# Patient Record
Sex: Female | Born: 1950 | Race: White | Hispanic: No | Marital: Married | Smoking: Current every day smoker
Health system: Southern US, Community
[De-identification: ages and names within clinical notes are randomized; demographics above are authoritative.]

## PROBLEM LIST (undated history)

## (undated) DIAGNOSIS — I8390 Asymptomatic varicose veins of unspecified lower extremity: Secondary | ICD-10-CM

## (undated) DIAGNOSIS — E669 Obesity, unspecified: Secondary | ICD-10-CM

## (undated) DIAGNOSIS — E119 Type 2 diabetes mellitus without complications: Secondary | ICD-10-CM

## (undated) DIAGNOSIS — E1140 Type 2 diabetes mellitus with diabetic neuropathy, unspecified: Secondary | ICD-10-CM

## (undated) DIAGNOSIS — I509 Heart failure, unspecified: Secondary | ICD-10-CM

## (undated) HISTORY — PX: LEG SURGERY: SHX1003

## (undated) HISTORY — PX: TUBAL LIGATION: SHX77

---

## 2000-08-30 ENCOUNTER — Encounter: Payer: Self-pay | Admitting: Emergency Medicine

## 2000-08-30 ENCOUNTER — Emergency Department (HOSPITAL_COMMUNITY): Admission: EM | Admit: 2000-08-30 | Discharge: 2000-08-30 | Payer: Self-pay | Admitting: Emergency Medicine

## 2000-10-18 ENCOUNTER — Ambulatory Visit (HOSPITAL_BASED_OUTPATIENT_CLINIC_OR_DEPARTMENT_OTHER): Admission: RE | Admit: 2000-10-18 | Discharge: 2000-10-18 | Payer: Self-pay | Admitting: Orthopaedic Surgery

## 2004-09-22 ENCOUNTER — Encounter: Admission: RE | Admit: 2004-09-22 | Discharge: 2004-09-22 | Payer: Self-pay | Admitting: Internal Medicine

## 2006-03-29 ENCOUNTER — Ambulatory Visit (HOSPITAL_BASED_OUTPATIENT_CLINIC_OR_DEPARTMENT_OTHER): Admission: RE | Admit: 2006-03-29 | Discharge: 2006-03-29 | Payer: Self-pay | Admitting: Orthopedic Surgery

## 2006-03-29 ENCOUNTER — Encounter (INDEPENDENT_AMBULATORY_CARE_PROVIDER_SITE_OTHER): Payer: Self-pay | Admitting: *Deleted

## 2006-07-21 ENCOUNTER — Encounter: Admission: RE | Admit: 2006-07-21 | Discharge: 2006-07-21 | Payer: Self-pay | Admitting: Internal Medicine

## 2007-03-02 ENCOUNTER — Encounter: Admission: RE | Admit: 2007-03-02 | Discharge: 2007-03-02 | Payer: Self-pay | Admitting: Interventional Radiology

## 2007-05-30 ENCOUNTER — Encounter: Admission: RE | Admit: 2007-05-30 | Discharge: 2007-05-30 | Payer: Self-pay | Admitting: Interventional Radiology

## 2007-06-06 ENCOUNTER — Encounter: Admission: RE | Admit: 2007-06-06 | Discharge: 2007-06-06 | Payer: Self-pay | Admitting: Interventional Radiology

## 2007-06-29 ENCOUNTER — Encounter: Admission: RE | Admit: 2007-06-29 | Discharge: 2007-06-29 | Payer: Self-pay | Admitting: Interventional Radiology

## 2007-12-13 ENCOUNTER — Encounter: Payer: Self-pay | Admitting: Interventional Radiology

## 2008-11-02 IMAGING — US US MFM FOLLOW-UP FOCUS VISIT
1 series · 13 of 28 positions shown · non-contrast
Comparison: none

[Series 1: us mfm follow-up focus visit · 13 of 33 slices shown]
[im 2/33]
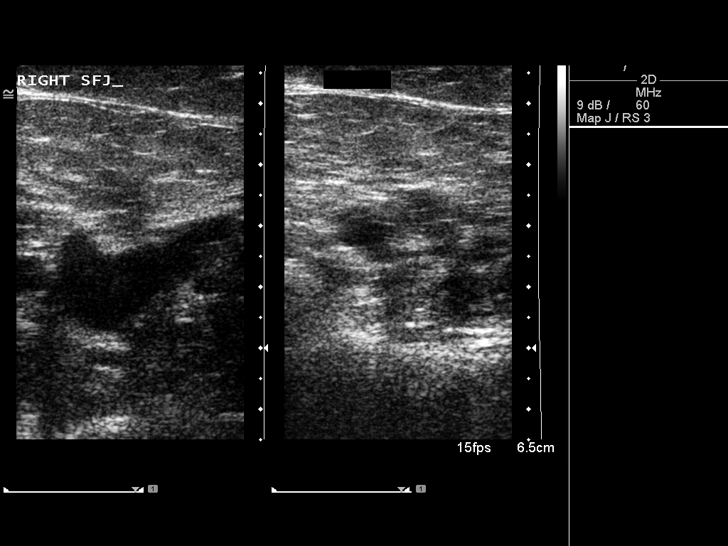
[im 4/33]
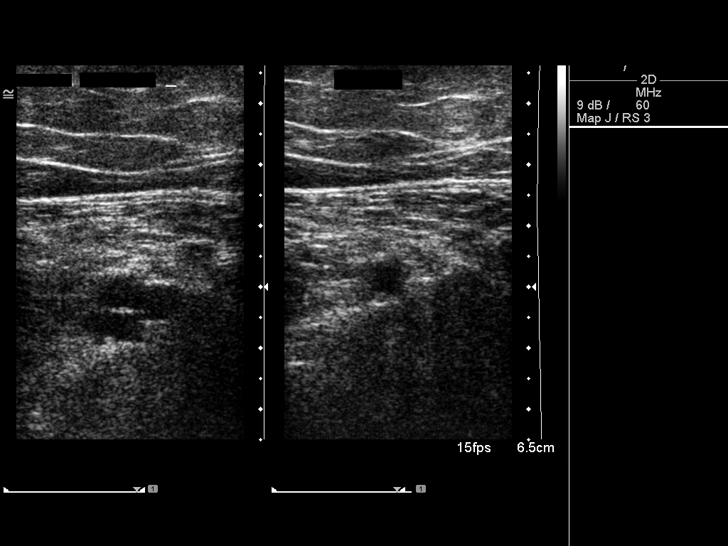
[im 6/33]
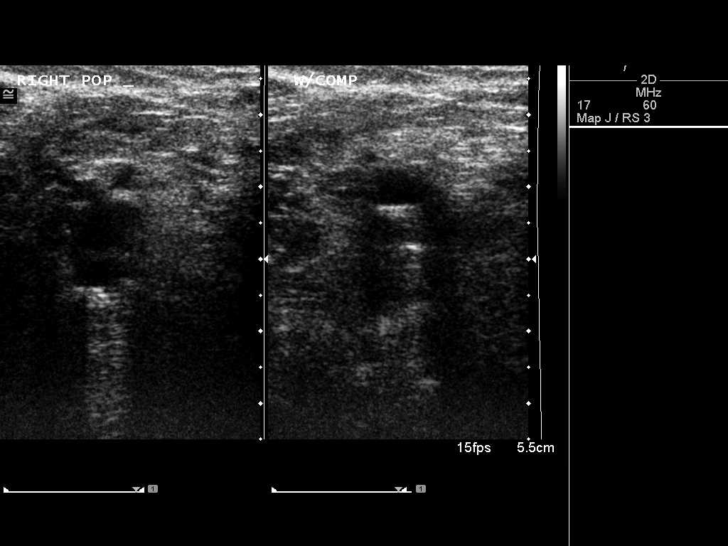
[im 9/33]
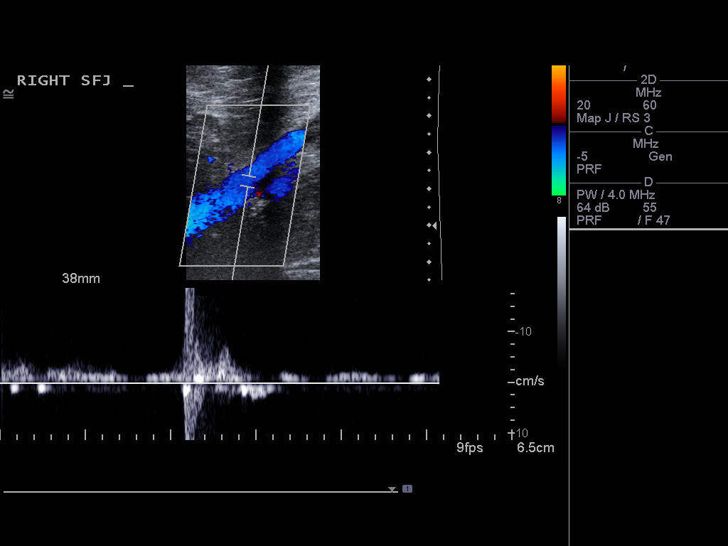
[im 11/33]
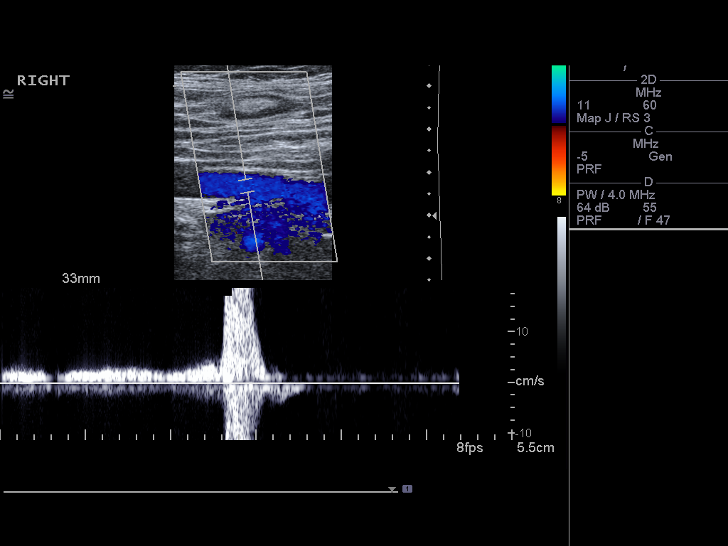
[im 14/33]
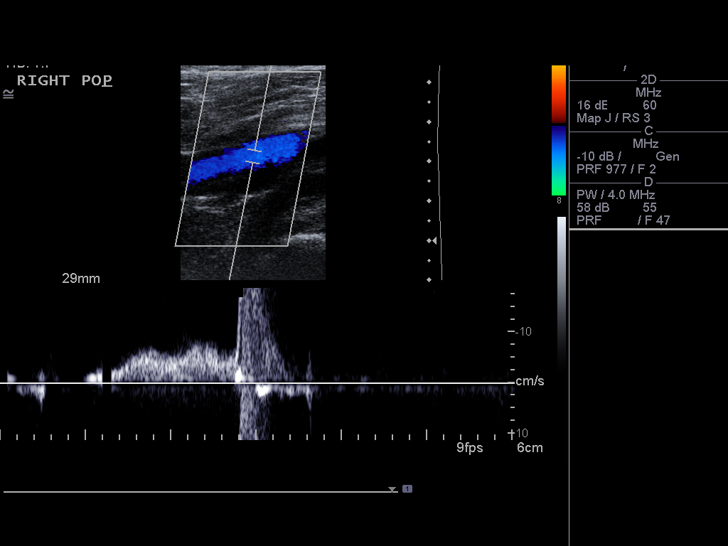
[im 17/33]
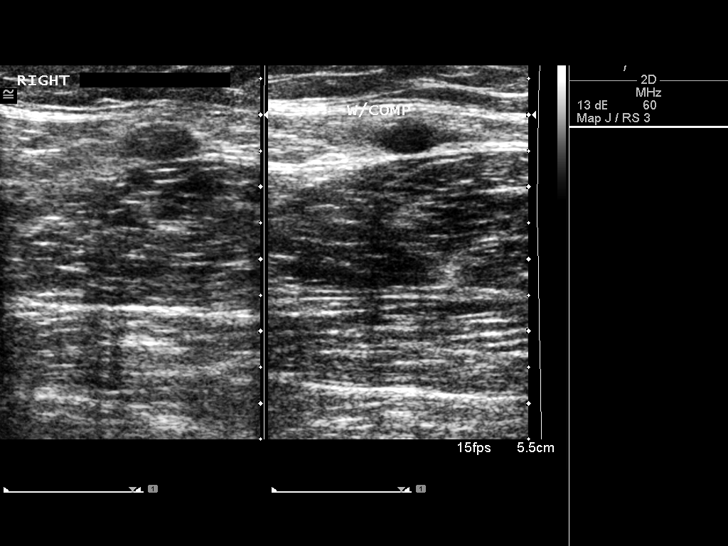
[im 19/33]
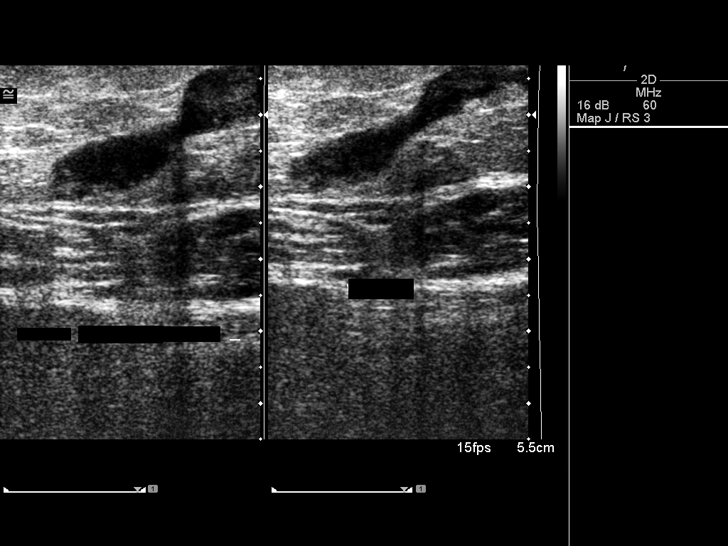
[im 22/33]
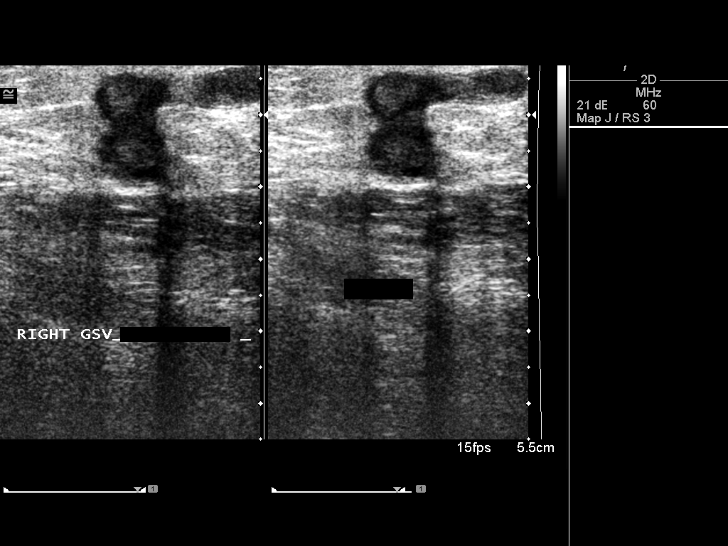
[im 24/33]
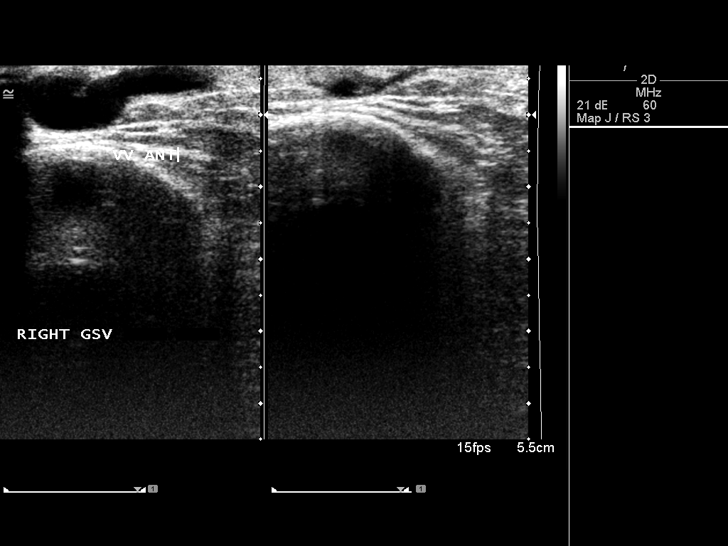
[im 27/33]
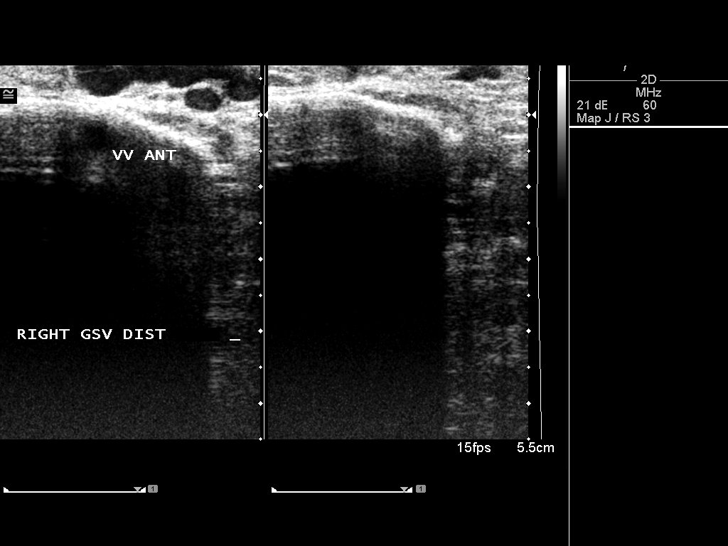
[im 29/33]
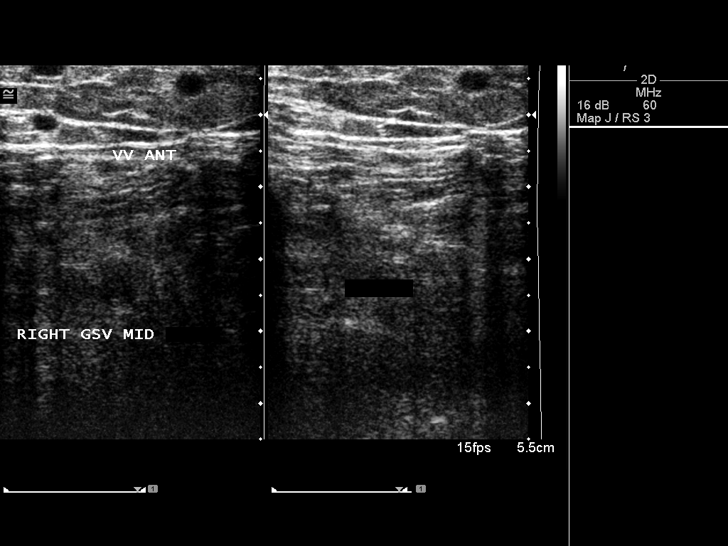
[im 31/33]
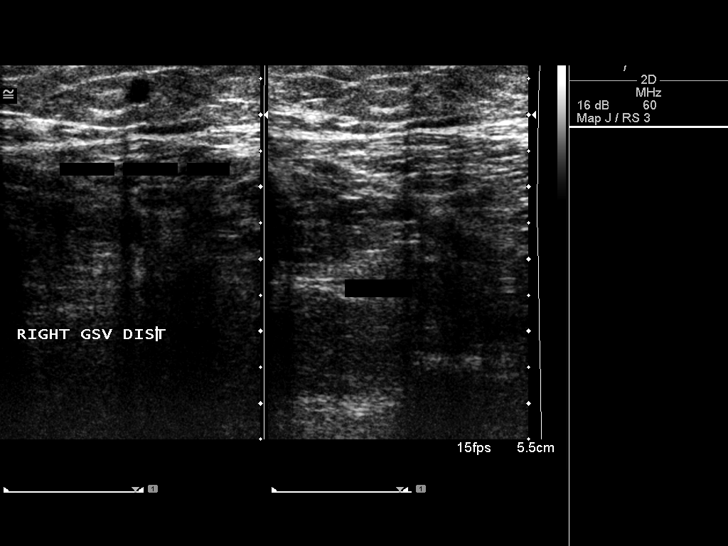

[13 of 28 positions shown; findings below may reference images not displayed]

ULTRASOUND FOCUS VISIT - 06/29/07:
 June 29, 2007
 Deniisa Goy, M.D. 
 [REDACTED] [REDACTED]
 RE:  Mihku (DOB - [birth year])
 Dear Dr. Sohail Gusman:
 I had the opportunity to see your patient, Tze Leung, today, at her scheduled appointment one month status post transcatheter laser occlusion of the right greater saphenous vein to treat her symptomatic varicose veins. 
 She is doing well.  The tenderness along the treatment segment has decreased.  Bruising has resolved.  She still notices some distended varicose veins along the anterior and medial aspect of her calf when she is standing and active.  These decompress with leg elevation and at night.  Her leg pain has improved.  She is able to tolerate full range of activities.  She is using and tolerating her thigh high graduated compression hose.  
 On exam, there is no significant tenderness, redness, or fluctuance along the treatment course.  The bruising has resolved.  There are still moderately large and mildly distended varicose veins along the anterior and medial aspect of the calf.  
 Today?s venous Doppler examination shows continued complete closure of the treated GSV segment with occlusion of some of the larger varicose veins at the level of the knee.  The network of varicose veins anterior and medial to the calf are again evident, with communication to a perforator vein noted.  
 My impression is that she has done well one month status post transcatheter laser occlusion of the right greater saphenous vein for her symptomatic varicose veins, with good technical success in the treated segment.  I had hoped that her calf varicose veins would more significantly decompress once the retrograde flow from the saphenous was eliminated.  However, these appear to continue to be filled via this prominent perforator.  Should these remain symptomatic, she would certainly be an appropriate candidate for percutaneous directed foam sclerotherapy injection into these residual varicose veins, with an anticipated durable result.  I discussed the procedure, technique, anticipated benefits, and possible side effects with the patient.  She was inclined to proceed with this before the [REDACTED]time.  Accordingly, we will try to set this up at her convenience. 
 I appreciate your referral of this very pleasant patient.  I am optimistic we will be able to give her further relief of her symptoms from the varicose veins.  I will keep you updated with her progress.
 Sincerely,
 DDH:chc

## 2010-05-17 ENCOUNTER — Encounter: Payer: Self-pay | Admitting: Internal Medicine

## 2010-09-11 NOTE — Op Note (Signed)
[facility]  Patient:    Marie Stone, Marie Stone                      MRN: [id number] Proc. Date: 10/18/00 Adm. Date:  [id number] Attending:  Marcene Corning                           Operative Report  PREOPERATIVE DIAGNOSIS:  Right knee anterior cruciate ligament tear.  POSTOPERATIVE DIAGNOSIS: 1. Right knee anterior cruciate ligament tear. 2. Right knee degenerative joint disease.  OPERATION PERFORMED: 1. Right knee anterior cruciate ligament debridement. 2. Right knee chondroplasty, medial femoral condyle and lateral femoral condyle.  ANESTHESIA:  Knee block.  ATTENDING SURGEON:  Lubertha Basque. Jerl Santos, M.D.  ASSISTANT:  Lindwood Qua, P.A.  INDICATIONS FOR PROCEDURE:  The patient is a 60 year old woman who injured her knee some time ago.  She has undergone a preoperative MRI scan which shows an ACL tear and some mild degenerative changes.  At this point she cannot straighten her knee fully.  She was offered an arthroscopy to go in and perform a debridement of the impinging portion of her ACL and to address other things found.  We elected not to go ahead with an ACL reconstruction as she has a fairly low demand knee in terms of athletic endeavors and does not feel any instability.  The procedure was discussed with the patient and informed operative consent was obtained after discussion of possible complications of reaction to anesthesia, infection and possible need for further surgery.  DESCRIPTION OF PROCEDURE:  The patient was taken to an operating suite where knee block anesthesia was applied without difficulty.  She was then positioned supine and prepped and draped in normal sterile fashion.  After administration of preop IV antibiotics, an arthroscopy of the right knee was performed through two inferior portals.  The suprapatellar pouch was benign as was the patellofemoral joint.  Both gutters were benign.  The medial compartment exhibited  some grade 3 change in a tiny area which was dressed with a brief chondroplasty.  In the lateral compartment she likewise had some grade three change in a very small area and this was addressed with a chondroplasty.  The meniscal structures in both compartments were normal.  In the notch she had an intact PCL with an at least partially torn ACL.  A stump of the ACL was stuck in an anterior position and was excised.  Perhaps 60% of an ACL remained though it certainly did not appear normal, it did appear to tighten on drawer testing.  This was left in place.  The knee was thoroughly irrigated followed by placement of Marcaine with epinephrine and morphine.  Adaptic was placed over her portals followed by dry gauze and a loose Ace wrap.  Estimated blood loss and intraoperative fluids can be obtained from Anesthesia records.  DISPOSITION:  The patient was taken to the recovery room in stable condition. Plans were for her to go home the same day and to follow up in the office in less than a week.  I will contact her by phone tonight. DD:  10/18/00 TD:  10/18/00 Job: [id number]

## 2010-09-11 NOTE — Op Note (Signed)
NAMEJARETZI, DROZ               ACCOUNT NO.:  [id number]   MEDICAL RECORD NO.:  [id number]          PATIENT TYPE:  AMB   LOCATION:  DSC                          FACILITY:  [facility]   PHYSICIAN:  Cindee Salt, M.D.       DATE OF BIRTH:  [birth year]   DATE OF PROCEDURE:  03/29/2006  DATE OF DISCHARGE:                               OPERATIVE REPORT   PREOPERATIVE DIAGNOSIS:  Fungal infection, right thumb nail plate, nail  bed.   POSTOPERATIVE DIAGNOSIS:  Fungal infection, right thumb nail plate, nail  bed.   OPERATION:  Removal nail plate, debridement of nail matrix right thumb.   SURGEON:  Cindee Salt, M.D.   ASSISTANT:  Carolyne Fiscal R.N.   ANESTHESIA:  Metacarpal block.   HISTORY:  The patient is a 60 year old female with a history of  infection of her right thumb nail bed.  This has lifted the entire nail,  has obvious thickening of the nail with discoloration with probability  being a fungal infection.  Plan is for removal of the nail plate  debridement.   PROCEDURE:  The patient was given a metacarpal block with 1% Xylocaine  without epinephrine, prepped using DuraPrep, supine position, right arm  free.  After adequate anesthesia was obtained, the thumb was  exsanguinated from the nail IP joint proximally.  The nail plate was  removed with a Therapist, nutritional.  Significant white drainage was present  and this was quite thick and tenacious.  This was then debrided along  with the thickening of the nail matrix distally.  This was irrigated.  The specimen sent for pathology for AFB, fungal, aerobic and anaerobic  cultures.  A nonadherent gauze was placed and a compressive dressing  applied.  The patient tolerated the procedure well.  She is discharged  home to return in three days on Tylenol 3 and Lamisil.           ______________________________  Cindee Salt, M.D.     GK/MEDQ  D:  03/29/2006  T:  03/30/2006  Job:  [id number]

## 2011-08-05 ENCOUNTER — Encounter (HOSPITAL_COMMUNITY): Payer: Self-pay | Admitting: *Deleted

## 2011-08-05 ENCOUNTER — Emergency Department (HOSPITAL_COMMUNITY)
Admission: EM | Admit: 2011-08-05 | Discharge: 2011-08-05 | Disposition: A | Discharge status: Home / Self Care | Payer: Self-pay | Attending: Emergency Medicine | Admitting: Emergency Medicine

## 2011-08-05 ENCOUNTER — Emergency Department (HOSPITAL_COMMUNITY): Payer: Self-pay

## 2011-08-05 DIAGNOSIS — T07XXXA Unspecified multiple injuries, initial encounter: Secondary | ICD-10-CM

## 2011-08-05 DIAGNOSIS — W19XXXA Unspecified fall, initial encounter: Secondary | ICD-10-CM

## 2011-08-05 DIAGNOSIS — IMO0002 Reserved for concepts with insufficient information to code with codable children: Secondary | ICD-10-CM | POA: Insufficient documentation

## 2011-08-05 DIAGNOSIS — S46919A Strain of unspecified muscle, fascia and tendon at shoulder and upper arm level, unspecified arm, initial encounter: Secondary | ICD-10-CM

## 2011-08-05 DIAGNOSIS — W010XXA Fall on same level from slipping, tripping and stumbling without subsequent striking against object, initial encounter: Secondary | ICD-10-CM | POA: Insufficient documentation

## 2011-08-05 NOTE — Discharge Instructions (Signed)
Your x-rays today did not show any broken bones or dislocation in your shoulder. At this time your providers recommend rest, ice, compression and elevation to help reduce pain and swelling in your shoulder. Continue to use naproxen every 12 hours as instructed on the bottle. You may also use your tramadol at home as prescribed by your doctor for severe pains. Please followup with your doctor next week for continuation and treatment.   Shoulder Sprain A shoulder sprain is the result of damage to the tough, fiber-like tissues (ligaments) that help hold your shoulder in place. The ligaments may be stretched or torn. Besides the main shoulder joint (the ball and socket), there are several smaller joints that connect the bones in this area. A sprain usually involves one of those joints. Most often it is the acromioclavicular (or AC) joint. That is the joint that connects the collarbone (clavicle) and the shoulder blade (scapula) at the top point of the shoulder blade (acromion). A shoulder sprain is a mild form of what is called a shoulder separation. Recovering from a shoulder sprain may take some time. For some, pain lingers for several months. Most people recover without long term problems. CAUSES   A shoulder sprain is usually caused by some kind of trauma. This might be:   Falling on an outstretched arm.   Being hit hard on the shoulder.   Twisting the arm.   Shoulder sprains are more likely to occur in people who:   Play sports.   Have balance or coordination problems.  SYMPTOMS   Pain when you move your shoulder.   Limited ability to move the shoulder.   Swelling and tenderness on top of the shoulder.   Redness or warmth in the shoulder.   Bruising.   A change in the shape of the shoulder.  DIAGNOSIS  Your healthcare provider may:  Ask about your symptoms.   Ask about recent activity that might have caused those symptoms.   Examine your shoulder. You may be asked to do  simple exercises to test movement. The other shoulder will be examined for comparison.   Order some tests that provide a look inside the body. They can show the extent of the injury. The tests could include:   X-rays.   CT (computed tomography) scan.   MRI (magnetic resonance imaging) scan.  RISKS AND COMPLICATIONS  Loss of full shoulder motion.   Ongoing shoulder pain.  TREATMENT  How long it takes to recover from a shoulder sprain depends on how severe it was. Treatment options may include:  Rest. You should not use the arm or shoulder until it heals.   Ice. For 2 or 3 days after the injury, put an ice pack on the shoulder up to 4 times a day. It should stay on for 15 to 20 minutes each time. Wrap the ice in a towel so it does not touch your skin.   Over-the-counter medicine to relieve pain.   A sling or brace. This will keep the arm still while the shoulder is healing.   Physical therapy or rehabilitation exercises. These will help you regain strength and motion. Ask your healthcare provider when it is OK to begin these exercises.   Surgery. The need for surgery is rare with a sprained shoulder, but some people may need surgery to keep the joint in place and reduce pain.  HOME CARE INSTRUCTIONS   Ask your healthcare provider about what you should and should not do while your shoulder  heals.   Make sure you know how to apply ice to the correct area of your shoulder.   Talk with your healthcare provider about which medications should be used for pain and swelling.   If rehabilitation therapy will be needed, ask your healthcare provider to refer you to a therapist. If it is not recommended, then ask about at-home exercises. Find out when exercise should begin.  SEEK MEDICAL CARE IF:  Your pain, swelling, or redness at the joint increases. SEEK IMMEDIATE MEDICAL CARE IF:   You have a fever.   You cannot move your arm or shoulder.  Document Released: 08/29/2008 Document  Revised: 04/01/2011 Document Reviewed: 08/29/2008 [facility] Patient Information 2012 [place], [place].    Abrasions Abrasions are skin scrapes. Their treatment depends on how large and deep the abrasion is. Abrasions do not extend through all layers of the skin. A cut or lesion through all skin layers is called a laceration. HOME CARE INSTRUCTIONS   If you were given a dressing, change it at least once a day or as instructed by your caregiver. If the bandage sticks, soak it off with a solution of water or hydrogen peroxide.   Twice a day, wash the area with soap and water to remove all the cream/ointment. You may do this in a sink, under a tub faucet, or in a shower. Rinse off the soap and pat dry with a clean towel. Look for signs of infection (see below).   Reapply cream/ointment according to your caregiver's instruction. This will help prevent infection and keep the bandage from sticking. Telfa or gauze over the wound and under the dressing or wrap will also help keep the bandage from sticking.   If the bandage becomes wet, dirty, or develops a foul smell, change it as soon as possible.   Only take over-the-counter or prescription medicines for pain, discomfort, or fever as directed by your caregiver.  SEEK IMMEDIATE MEDICAL CARE IF:   Increasing pain in the wound.   Signs of infection develop: redness, swelling, surrounding area is tender to touch, or pus coming from the wound.   You have a fever.   Any foul smell coming from the wound or dressing.  Most skin wounds heal within ten days. Facial wounds heal faster. However, an infection may occur despite proper treatment. You should have the wound checked for signs of infection within 24 to 48 hours or sooner if problems arise. If you were not given a wound-check appointment, look closely at the wound yourself on the second day for early signs of infection listed above. MAKE SURE YOU:   Understand these instructions.   Will watch  your condition.   Will get help right away if you are not doing well or get worse.  Document Released: 01/20/2005 Document Revised: 04/01/2011 Document Reviewed: 03/16/2011 [facility] Patient Information 2012 [place], [place].

## 2011-08-05 NOTE — ED Notes (Signed)
Patient d/c prior to ortho tech arriving for arm sling.  Patient states she will buy one at local drug store to save money. Patient aware of sling instructions and has previous knowledge of wearing arm sling

## 2011-08-05 NOTE — ED Notes (Signed)
Pt sts fell while she was ambulating. Pt now has pain with movement to her L shoulder. CMS intact.

## 2011-08-05 NOTE — ED Provider Notes (Signed)
History     CSN: [id number]  Arrival date & time 08/05/11  1940   First MD Initiated Contact with Patient 08/05/11 2115      Chief Complaint  Patient presents with  . Fall    HPI  History provided by the patient and spouse. Patient is a 61 year old female with no significant past medical history who presents with left shoulder pains after a fall. Patient reports walking outside and slightly mis-stepping with right foot off the edges of the St. causing her to lose balance and fall. Patient reports falling onto left side. She is small abrasion to left great toe, left knee and left elbow. Patient complains of left shoulder pain this is worse with movements. Patient did try taking naproxen earlier today with only slight relief. Pain is better with rest. Patient denies head injury, neck pain or LOC. She denies any numbness or weakness in extremities. Symptoms are described as moderate. Patient denies any other aggravating or alleviating factors.    No past medical history on file.  No past surgical history on file.  No family history on file.  History  Substance Use Topics  . Smoking status: Not on file  . Smokeless tobacco: Not on file  . Alcohol Use: Not on file    OB History    Grav Para Term Preterm Abortions TAB SAB Ect Mult Living                  Review of Systems  HENT: Negative for neck pain.   Respiratory: Negative for shortness of breath.   Cardiovascular: Negative for chest pain.  Gastrointestinal: Negative for abdominal pain.  Musculoskeletal: Negative for back pain.  Neurological: Negative for dizziness, weakness, light-headedness, numbness and headaches.    Allergies  Review of patient's allergies indicates no known allergies.  Home Medications   Current Outpatient Rx  Name Route Sig Dispense Refill  . NAPROXEN PO Oral Take 1 tablet by mouth as needed. For pain.      BP 153/83  Pulse 91  Temp(Src) 98.4 F (36.9 C) (Oral)  Resp 20  SpO2  95%  Physical Exam  Nursing note and vitals reviewed. Constitutional: She is oriented to person, place, and time. She appears well-developed and well-nourished. No distress.  HENT:  Head: Normocephalic and atraumatic.       No battle sign or raccoon eyes  Neck: Normal range of motion. Neck supple.       No cervical midline tenderness  Cardiovascular: Normal rate and regular rhythm.   Pulmonary/Chest: Effort normal and breath sounds normal. No respiratory distress. She has no wheezes. She has no rales.  Musculoskeletal:       Reduced range of motion of left shoulder secondary to pain. No gross deformity. No tenderness palpation over clavicle or a.c. joint. Pain over anterior shoulder. Normal range of motion of elbow, wrist and hand. Normal grip strength. Normal radial pulses, sensation in fingers and cap refill.  Neurological: She is alert and oriented to person, place, and time. She has normal strength. No sensory deficit. Gait normal.  Skin: Skin is warm and dry. No rash noted.       1 cm circular abrasion to medial left great toe. Small abrasions over left knee. 4 cm abrasion over her posterior left elbow with mild swelling.  Psychiatric: She has a normal mood and affect. Her behavior is normal.    ED Course  Procedures   Dg Shoulder Left  08/05/2011  *RADIOLOGY REPORT*  Clinical Data: Fall.  Shoulder pain.  Limited mobility.  LEFT SHOULDER - 2+ VIEW  Comparison: None.  Findings: Acromioclavicular joint degenerative changes with bony overgrowth.  No fracture or dislocation.  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  No fracture or dislocation.  Original Report Authenticated By: Fuller Canada, M.D.     1. Fall   2. Abrasions of multiple sites   3. Shoulder strain       MDM  9:30 PM patient seen and evaluated. Patient in no acute distress.        Angus Seller, [place] 08/06/11 [phone]

## 2011-08-09 NOTE — ED Provider Notes (Signed)
Medical screening examination/treatment/procedure(s) were performed by non-physician practitioner and as supervising physician I was immediately available for consultation/collaboration.   Pheonix Clinkscale, MD 08/09/11 2117 

## 2012-12-20 ENCOUNTER — Other Ambulatory Visit: Payer: Self-pay | Admitting: Family Medicine

## 2012-12-20 DIAGNOSIS — Z1231 Encounter for screening mammogram for malignant neoplasm of breast: Secondary | ICD-10-CM

## 2013-01-10 ENCOUNTER — Ambulatory Visit: Payer: Self-pay

## 2013-02-13 ENCOUNTER — Emergency Department (HOSPITAL_COMMUNITY): Payer: BC Managed Care – PPO

## 2013-02-13 ENCOUNTER — Inpatient Hospital Stay (HOSPITAL_COMMUNITY)
Admission: EM | Admit: 2013-02-13 | Discharge: 2013-02-23 | DRG: 872 | Disposition: A | Discharge status: Home / Self Care | Payer: BC Managed Care – PPO | Attending: Internal Medicine | Admitting: Internal Medicine

## 2013-02-13 ENCOUNTER — Encounter (HOSPITAL_COMMUNITY): Payer: Self-pay | Admitting: Emergency Medicine

## 2013-02-13 DIAGNOSIS — E1169 Type 2 diabetes mellitus with other specified complication: Secondary | ICD-10-CM | POA: Diagnosis present

## 2013-02-13 DIAGNOSIS — I809 Phlebitis and thrombophlebitis of unspecified site: Secondary | ICD-10-CM | POA: Diagnosis present

## 2013-02-13 DIAGNOSIS — E785 Hyperlipidemia, unspecified: Secondary | ICD-10-CM | POA: Diagnosis present

## 2013-02-13 DIAGNOSIS — A4101 Sepsis due to Methicillin susceptible Staphylococcus aureus: Secondary | ICD-10-CM | POA: Diagnosis present

## 2013-02-13 DIAGNOSIS — E669 Obesity, unspecified: Secondary | ICD-10-CM | POA: Diagnosis present

## 2013-02-13 DIAGNOSIS — F17200 Nicotine dependence, unspecified, uncomplicated: Secondary | ICD-10-CM | POA: Diagnosis present

## 2013-02-13 DIAGNOSIS — E876 Hypokalemia: Secondary | ICD-10-CM | POA: Diagnosis not present

## 2013-02-13 DIAGNOSIS — I8390 Asymptomatic varicose veins of unspecified lower extremity: Secondary | ICD-10-CM | POA: Diagnosis present

## 2013-02-13 DIAGNOSIS — A419 Sepsis, unspecified organism: Principal | ICD-10-CM | POA: Diagnosis present

## 2013-02-13 DIAGNOSIS — D696 Thrombocytopenia, unspecified: Secondary | ICD-10-CM | POA: Diagnosis present

## 2013-02-13 DIAGNOSIS — I959 Hypotension, unspecified: Secondary | ICD-10-CM | POA: Diagnosis present

## 2013-02-13 DIAGNOSIS — R112 Nausea with vomiting, unspecified: Secondary | ICD-10-CM | POA: Diagnosis not present

## 2013-02-13 DIAGNOSIS — E119 Type 2 diabetes mellitus without complications: Secondary | ICD-10-CM | POA: Diagnosis present

## 2013-02-13 DIAGNOSIS — Z23 Encounter for immunization: Secondary | ICD-10-CM

## 2013-02-13 HISTORY — DX: Type 2 diabetes mellitus without complications: E11.9

## 2013-02-13 LAB — CBC WITH DIFFERENTIAL/PLATELET
Basophils Relative: 0 % (ref 0–1)
HCT: 43.1 % (ref 36.0–46.0)
Hemoglobin: 15.5 g/dL — ABNORMAL HIGH (ref 12.0–15.0)
Lymphocytes Relative: 1 % — ABNORMAL LOW (ref 12–46)
Monocytes Absolute: 0.6 10*3/uL (ref 0.1–1.0)
Monocytes Relative: 4 % (ref 3–12)
Neutro Abs: 15.3 10*3/uL — ABNORMAL HIGH (ref 1.7–7.7)
Neutrophils Relative %: 95 % — ABNORMAL HIGH (ref 43–77)
RBC: 4.85 MIL/uL (ref 3.87–5.11)
WBC: 16.1 10*3/uL — ABNORMAL HIGH (ref 4.0–10.5)

## 2013-02-13 LAB — COMPREHENSIVE METABOLIC PANEL
AST: 30 U/L (ref 0–37)
Albumin: 3.4 g/dL — ABNORMAL LOW (ref 3.5–5.2)
Alkaline Phosphatase: 110 U/L (ref 39–117)
BUN: 6 mg/dL (ref 6–23)
CO2: 21 mEq/L (ref 19–32)
Chloride: 98 mEq/L (ref 96–112)
Creatinine, Ser: 0.61 mg/dL (ref 0.50–1.10)
GFR calc non Af Amer: >90 mL/min (ref >90)
Potassium: 3.3 mEq/L — ABNORMAL LOW (ref 3.5–5.1)
Total Bilirubin: 0.6 mg/dL (ref 0.3–1.2)

## 2013-02-13 LAB — URINE MICROSCOPIC-ADD ON

## 2013-02-13 LAB — URINALYSIS, ROUTINE W REFLEX MICROSCOPIC
Leukocytes, UA: NEGATIVE
Nitrite: NEGATIVE
Protein, ur: NEGATIVE mg/dL
Specific Gravity, Urine: 1.022 (ref 1.005–1.030)
Urobilinogen, UA: 0.2 mg/dL (ref 0.0–1.0)

## 2013-02-13 LAB — CG4 I-STAT (LACTIC ACID): Lactic Acid, Venous: 3.19 mmol/L — ABNORMAL HIGH (ref 0.5–2.2)

## 2013-02-13 MED ORDER — SODIUM CHLORIDE 0.9 % IV BOLUS (SEPSIS)
1000.0000 mL | Freq: Once | INTRAVENOUS | Status: AC
  Administered 2013-02-13: 1000 mL via INTRAVENOUS

## 2013-02-13 MED ORDER — ACETAMINOPHEN 325 MG PO TABS
650.0000 mg | ORAL_TABLET | Freq: Once | ORAL | Status: AC
  Administered 2013-02-13: 650 mg via ORAL
  Filled 2013-02-13: qty 2

## 2013-02-13 MED ORDER — VANCOMYCIN HCL IN DEXTROSE 1-5 GM/200ML-% IV SOLN
1000.0000 mg | Freq: Once | INTRAVENOUS | Status: AC
  Administered 2013-02-13: 1000 mg via INTRAVENOUS
  Filled 2013-02-13: qty 200

## 2013-02-13 MED ORDER — VANCOMYCIN HCL IN DEXTROSE 1-5 GM/200ML-% IV SOLN
1000.0000 mg | Freq: Three times a day (TID) | INTRAVENOUS | Status: DC
  Administered 2013-02-14 – 2013-02-15 (×6): 1000 mg via INTRAVENOUS
  Filled 2013-02-13 (×9): qty 200

## 2013-02-13 MED ORDER — SODIUM CHLORIDE 0.9 % IV SOLN
3.0000 g | Freq: Three times a day (TID) | INTRAVENOUS | Status: DC
  Administered 2013-02-14 – 2013-02-15 (×5): 3 g via INTRAVENOUS
  Filled 2013-02-13 (×6): qty 3

## 2013-02-13 NOTE — ED Notes (Signed)
PA at bedside.

## 2013-02-13 NOTE — ED Notes (Signed)
Pt. reports right leg pain onset last night denies injury , pt. stated " vein surgery " at right leg 3 weeks ago by Dr. Lendon Ka , pt. Also reported poor appetite . Febrile at triage .

## 2013-02-13 NOTE — Consult Note (Signed)
ANTIBIOTIC CONSULT NOTE - INITIAL  Pharmacy Consult for Vancomycin Indication: sepsis  No Known Allergies  Patient Measurements: Height: 5\' 5"  (165.1 cm) Weight: 210 lb (95.255 kg) IBW/kg (Calculated) : 57  Vital Signs: Temp: 99.5 F (37.5 C) (10/21 2145) Temp src: Oral (10/21 2145) BP: 101/48 mmHg (10/21 2145) Pulse Rate: 81 (10/21 2145) Intake/Output from previous day:   Intake/Output from this shift:    Labs:  Recent Labs  02/13/13 1951  WBC 16.1*  HGB 15.5*  PLT 179  CREATININE 0.61   Estimated Creatinine Clearance: 84.3 ml/min (by C-G formula based on Cr of 0.61).  Microbiology: No results found for this or any previous visit (from the past 720 hour(s)).  Medical History: Past Medical History  Diagnosis Date  . Diabetes mellitus without complication    Assessment: 62yo diabetic female presents to the ED with fever, leukocytosis, and right leg pain (s/p vein surgery 3 weeks ago). She will begin empiric vancomycin for probable sepsis. Renal function wnl.  Goal of Therapy:  Vancomycin trough level 15-20 mcg/ml  Plan:  1) Vancomycin 1000mg  IV q8 2) Follow renal function, cultures, LOT, trough at steady state  Fredrik Rigger 02/13/2013,10:00 PM

## 2013-02-13 NOTE — ED Notes (Signed)
Hung 2 liters of NaCl 0.9% per sepsis protocol.

## 2013-02-13 NOTE — Progress Notes (Signed)
ANTIBIOTIC CONSULT NOTE - INITIAL  Pharmacy Consult for Unasyn Indication: cellulitis  No Known Allergies  Patient Measurements: Height: 5\' 5"  (165.1 cm) Weight: 210 lb (95.255 kg) IBW/kg (Calculated) : 57  Vital Signs: Temp: 99.5 F (37.5 C) (10/21 2145) Temp src: Oral (10/21 2145) BP: 97/52 mmHg (10/21 2200) Pulse Rate: 78 (10/21 2200)  Labs:  Recent Labs  02/13/13 1951  WBC 16.1*  HGB 15.5*  PLT 179  CREATININE 0.61   Estimated Creatinine Clearance: 84.3 ml/min (by C-G formula based on Cr of 0.61). No results found for this basename: VANCOTROUGH, VANCOPEAK, VANCORANDOM, GENTTROUGH, GENTPEAK, GENTRANDOM, TOBRATROUGH, TOBRAPEAK, TOBRARND, AMIKACINPEAK, AMIKACINTROU, AMIKACIN,  in the last 72 hours   Microbiology: No results found for this or any previous visit (from the past 720 hour(s)).  Medical History: Past Medical History  Diagnosis Date  . Diabetes mellitus without complication     Assessment: 62 y/o with cellulitis with vein ablation ~ 2 weeks ago. WBC 16.1, Renal function ok, Tmax 102.9.   10/21 Vanc>> 10/21 Unasyn>>  Goal of Therapy:  Clinical resolution   Plan:  -Unasyn 3g IV q8h -Vancomycin per previous note -Trend WBC, temp, renal function  Thank you for allowing me to take part in this patient's care,  Abran Duke, PharmD Clinical Pharmacist Phone: [phone] Pager: [phone] 02/13/2013 11:11 PM

## 2013-02-13 NOTE — ED Provider Notes (Signed)
CSN: [id number]     Arrival date & time 02/13/13  1937 History   First MD Initiated Contact with Patient 02/13/13 2143     Chief Complaint  Patient presents with  . Leg Pain   (Consider location/radiation/quality/duration/timing/severity/associated sxs/prior Treatment) The history is provided by the patient, medical records and the spouse.    Marie Stone is a 62 year old female with past medical history of diabetes who presents the emergency department with chief complaint of right leg pain and fever.  Patient had a vein ablation procedure 2 weeks ago.  She states that since then she has had increasing pain in that leg.  Her husband states that she has been very somnolent At home with intermittent crying.  Patient states she has been unable to specify why she is crying except for that she feels "very bad."  The patient has had fever with a maximum temperature of 103.9 at home along with severe leg pain, drainage, myalgias and arthralgias. Her husband states that today the patient was confused as well.  Denies DOE, SOB, chest tightness or pressure, radiation to left arm, jaw or back, or diaphoresis. Denies dysuria, flank pain, suprapubic pain, frequency, urgency, or hematuria. Denies headaches, light headedness, weakness, visual disturbances. Denies abdominal pain, nausea, vomiting, diarrhea or constipation.    Past Medical History  Diagnosis Date  . Diabetes mellitus without complication    Past Surgical History  Procedure Laterality Date  . Leg surgery     No family history on file. History  Substance Use Topics  . Smoking status: Current Every Day Smoker  . Smokeless tobacco: Not on file  . Alcohol Use: No   OB History   Grav Para Term Preterm Abortions TAB SAB Ect Mult Living                 Review of Systems Ten systems reviewed and are negative for acute change, except as noted in the HPI.   Allergies  Review of patient's allergies indicates no known allergies.  Home  Medications   Current Outpatient Rx  Name  Route  Sig  Dispense  Refill  . atorvastatin (LIPITOR) 10 MG tablet   Oral   Take 10 mg by mouth every evening.         . Canagliflozin (INVOKANA) 100 MG TABS   Oral   Take 200 mg by mouth daily.         . sitaGLIPtin (JANUVIA) 100 MG tablet   Oral   Take 100 mg by mouth daily.          BP 97/52  Pulse 78  Temp(Src) 99.5 F (37.5 C) (Oral)  Resp 16  Ht 5\' 5"  (1.651 m)  Wt 210 lb (95.255 kg)  BMI 34.95 kg/m2  SpO2 97% Physical Exam  Nursing note and vitals reviewed. Constitutional: She is oriented to person, place, and time.  Toxic appearing patient in NAD  HENT:  Head: Normocephalic and atraumatic.  Eyes: Conjunctivae are normal.  Neck: Normal range of motion.  Cardiovascular: Regular rhythm and normal heart sounds.   tachycardic  Pulmonary/Chest: Effort normal and breath sounds normal. She has no wheezes.  Abdominal: Soft. Bowel sounds are normal. She exhibits no distension. There is no tenderness.  Neurological: She is alert and oriented to person, place, and time.  Skin:  Flushed appearance. R leg with bruising, induration. Erythema and heat. No fluctuance or drainage noted.  Psychiatric: Her behavior is normal.   . ED Course  Procedures (including critical  care time) Labs Review Labs Reviewed  CBC WITH DIFFERENTIAL - Abnormal; Notable for the following:    WBC 16.1 (*)    Hemoglobin 15.5 (*)    Neutrophils Relative % 95 (*)    Neutro Abs 15.3 (*)    Lymphocytes Relative 1 (*)    Lymphs Abs 0.2 (*)    All other components within normal limits  COMPREHENSIVE METABOLIC PANEL - Abnormal; Notable for the following:    Sodium 133 (*)    Potassium 3.3 (*)    Glucose, Bld 241 (*)    Albumin 3.4 (*)    All other components within normal limits  CG4 I-STAT (LACTIC ACID) - Abnormal; Notable for the following:    Lactic Acid, Venous 3.19 (*)    All other components within normal limits  CULTURE, BLOOD (SINGLE)   URINE CULTURE  URINALYSIS, ROUTINE W REFLEX MICROSCOPIC   Imaging Review No results found.  EKG Interpretation   None       MDM  No diagnosis found. Filed Vitals:   02/13/13 2130 02/13/13 2145 02/13/13 2145 02/13/13 2200  BP: 99/53 101/48  97/52  Pulse: 83 81  78  Temp:  99.5 F (37.5 C)    TempSrc:  Oral    Resp: 21 21  16   Height:   5\' 5"  (1.651 m)   Weight:   210 lb (95.255 kg)   SpO2: 94% 93%  97%    Patient septic with fever, leukocytosis, left shift,  Pressures are soft. Two liter are running in 2 IVs Vanc for cellulitis as suspected source.    Patient received 2 liters. Pressures still remain soft, however she is not tachycardic and she is alert. Will give the patient a 3rd fluid bolus  12:12 AM BP 109/57  Pulse 77  Temp(Src) 99.5 F (37.5 C) (Oral)  Resp 19  Ht 5\' 5"  (1.651 m)  Wt 210 lb (95.255 kg)  BMI 34.95 kg/m2  SpO2 95% Patient given 3 liters  IV vanc and unasyn. [place] pending. Will admit for sepsis. Paitent likely needs stepdown.    I have spoken With Dr. Conley Rolls who will admit the patient for sepsis. The patient appears reasonably stabilized for admission considering the current resources, flow, and capabilities available in the ED at this time, and I doubt any other [facility] requiring further screening and/or treatment in the ED prior to admission.   Arthor Captain, PA-C 02/14/13 2254

## 2013-02-14 ENCOUNTER — Encounter (HOSPITAL_COMMUNITY): Payer: Self-pay | Admitting: *Deleted

## 2013-02-14 DIAGNOSIS — I959 Hypotension, unspecified: Secondary | ICD-10-CM

## 2013-02-14 DIAGNOSIS — A419 Sepsis, unspecified organism: Secondary | ICD-10-CM | POA: Diagnosis present

## 2013-02-14 DIAGNOSIS — I809 Phlebitis and thrombophlebitis of unspecified site: Secondary | ICD-10-CM | POA: Diagnosis present

## 2013-02-14 DIAGNOSIS — E1169 Type 2 diabetes mellitus with other specified complication: Secondary | ICD-10-CM | POA: Diagnosis present

## 2013-02-14 DIAGNOSIS — E119 Type 2 diabetes mellitus without complications: Secondary | ICD-10-CM | POA: Diagnosis present

## 2013-02-14 LAB — HEPARIN LEVEL (UNFRACTIONATED): Heparin Unfractionated: <0.1 IU/mL — ABNORMAL LOW (ref 0.30–0.70)

## 2013-02-14 LAB — CBC
HCT: 38.7 % (ref 36.0–46.0)
HCT: 38.2 % (ref 36.0–46.0)
Hemoglobin: 13.2 g/dL (ref 12.0–15.0)
MCH: 30.4 pg (ref 26.0–34.0)
MCHC: 34.1 g/dL (ref 30.0–36.0)
MCHC: 34 g/dL (ref 30.0–36.0)
MCV: 89.5 fL (ref 78.0–100.0)
Platelets: 134 10*3/uL — ABNORMAL LOW (ref 150–400)
RBC: 4.36 MIL/uL (ref 3.87–5.11)
RDW: 13.2 % (ref 11.5–15.5)
RDW: 13.2 % (ref 11.5–15.5)
WBC: 11.7 10*3/uL — ABNORMAL HIGH (ref 4.0–10.5)
WBC: 10.5 10*3/uL (ref 4.0–10.5)

## 2013-02-14 LAB — GLUCOSE, CAPILLARY
Glucose-Capillary: 162 mg/dL — ABNORMAL HIGH (ref 70–99)
Glucose-Capillary: 160 mg/dL — ABNORMAL HIGH (ref 70–99)
Glucose-Capillary: 156 mg/dL — ABNORMAL HIGH (ref 70–99)
Glucose-Capillary: 146 mg/dL — ABNORMAL HIGH (ref 70–99)

## 2013-02-14 LAB — BASIC METABOLIC PANEL
BUN: 8 mg/dL (ref 6–23)
Chloride: 105 mEq/L (ref 96–112)
GFR calc Af Amer: >90 mL/min (ref >90)
Glucose, Bld: 200 mg/dL — ABNORMAL HIGH (ref 70–99)
Potassium: 2.8 mEq/L — ABNORMAL LOW (ref 3.5–5.1)
Sodium: 136 mEq/L (ref 135–145)

## 2013-02-14 MED ORDER — SODIUM CHLORIDE 0.9 % IV SOLN
INTRAVENOUS | Status: DC
  Administered 2013-02-14 – 2013-02-15 (×3): 1000 mL via INTRAVENOUS
  Administered 2013-02-16 – 2013-02-20 (×6): via INTRAVENOUS

## 2013-02-14 MED ORDER — HYDROMORPHONE HCL PF 1 MG/ML IJ SOLN
1.0000 mg | INTRAMUSCULAR | Status: DC | PRN
  Administered 2013-02-15 – 2013-02-21 (×17): 1 mg via INTRAVENOUS
  Filled 2013-02-14 (×17): qty 1

## 2013-02-14 MED ORDER — HEPARIN (PORCINE) IN NACL 100-0.45 UNIT/ML-% IJ SOLN
2550.0000 [IU]/h | INTRAMUSCULAR | Status: DC
  Administered 2013-02-14: 1550 [IU]/h via INTRAVENOUS
  Administered 2013-02-15: 1850 [IU]/h via INTRAVENOUS
  Administered 2013-02-16 – 2013-02-17 (×2): 2100 [IU]/h via INTRAVENOUS
  Administered 2013-02-18: 2550 [IU]/h via INTRAVENOUS
  Administered 2013-02-18: 2700 [IU]/h via INTRAVENOUS
  Administered 2013-02-18 – 2013-02-19 (×2): 2550 [IU]/h via INTRAVENOUS
  Filled 2013-02-14 (×15): qty 250

## 2013-02-14 MED ORDER — HEPARIN BOLUS VIA INFUSION
3000.0000 [IU] | Freq: Once | INTRAVENOUS | Status: AC
  Administered 2013-02-14: 3000 [IU] via INTRAVENOUS
  Filled 2013-02-14: qty 3000

## 2013-02-14 MED ORDER — INSULIN ASPART 100 UNIT/ML ~~LOC~~ SOLN
0.0000 [IU] | Freq: Three times a day (TID) | SUBCUTANEOUS | Status: DC
Start: 2013-02-14 — End: 2013-02-19
  Administered 2013-02-14: 3 [IU] via SUBCUTANEOUS
  Administered 2013-02-15 (×3): 2 [IU] via SUBCUTANEOUS
  Administered 2013-02-16: 3 [IU] via SUBCUTANEOUS
  Administered 2013-02-16 – 2013-02-18 (×3): 2 [IU] via SUBCUTANEOUS
  Administered 2013-02-18: 3 [IU] via SUBCUTANEOUS
  Administered 2013-02-19: 2 [IU] via SUBCUTANEOUS

## 2013-02-14 MED ORDER — SODIUM CHLORIDE 0.9 % IJ SOLN
3.0000 mL | Freq: Two times a day (BID) | INTRAMUSCULAR | Status: DC
  Administered 2013-02-14 – 2013-02-22 (×7): 3 mL via INTRAVENOUS

## 2013-02-14 MED ORDER — ATORVASTATIN CALCIUM 10 MG PO TABS
10.0000 mg | ORAL_TABLET | Freq: Every evening | ORAL | Status: DC
  Administered 2013-02-14 – 2013-02-21 (×8): 10 mg via ORAL
  Filled 2013-02-14 (×11): qty 1

## 2013-02-14 MED ORDER — ONDANSETRON HCL 4 MG PO TABS: 4.0000 mg | ORAL_TABLET | Freq: Four times a day (QID) | ORAL | Status: DC | PRN

## 2013-02-14 MED ORDER — POTASSIUM CHLORIDE CRYS ER 20 MEQ PO TBCR
40.0000 meq | EXTENDED_RELEASE_TABLET | Freq: Once | ORAL | Status: AC
  Administered 2013-02-14: 40 meq via ORAL
  Filled 2013-02-14: qty 2

## 2013-02-14 MED ORDER — DOCUSATE SODIUM 100 MG PO CAPS
100.0000 mg | ORAL_CAPSULE | Freq: Two times a day (BID) | ORAL | Status: DC
  Administered 2013-02-14 – 2013-02-22 (×16): 100 mg via ORAL
  Filled 2013-02-14 (×15): qty 1

## 2013-02-14 MED ORDER — PNEUMOCOCCAL VAC POLYVALENT 25 MCG/0.5ML IJ INJ
0.5000 mL | INJECTION | INTRAMUSCULAR | Status: AC
  Administered 2013-02-16: 0.5 mL via INTRAMUSCULAR
  Filled 2013-02-14: qty 0.5

## 2013-02-14 MED ORDER — HEPARIN (PORCINE) IN NACL 100-0.45 UNIT/ML-% IJ SOLN
1250.0000 [IU]/h | INTRAMUSCULAR | Status: DC
  Administered 2013-02-14 (×2): 1250 [IU]/h via INTRAVENOUS
  Filled 2013-02-14 (×2): qty 250

## 2013-02-14 MED ORDER — PNEUMOCOCCAL VAC POLYVALENT 25 MCG/0.5ML IJ INJ: 0.5000 mL | INJECTION | INTRAMUSCULAR | Status: DC

## 2013-02-14 MED ORDER — ACETAMINOPHEN 500 MG PO TABS
1000.0000 mg | ORAL_TABLET | Freq: Once | ORAL | Status: AC
  Administered 2013-02-14: 1000 mg via ORAL
  Filled 2013-02-14: qty 2

## 2013-02-14 MED ORDER — DEXTROSE-NACL 5-0.9 % IV SOLN
INTRAVENOUS | Status: DC
  Administered 2013-02-14: 100 mL/h via INTRAVENOUS

## 2013-02-14 MED ORDER — ONDANSETRON HCL 4 MG/2ML IJ SOLN
4.0000 mg | Freq: Four times a day (QID) | INTRAMUSCULAR | Status: DC | PRN
  Administered 2013-02-14 – 2013-02-23 (×5): 4 mg via INTRAVENOUS
  Filled 2013-02-14 (×5): qty 2

## 2013-02-14 MED ORDER — HEPARIN SODIUM (PORCINE) 5000 UNIT/ML IJ SOLN: 5000.0000 [IU] | Freq: Three times a day (TID) | INTRAMUSCULAR | Status: DC

## 2013-02-14 MED ORDER — ACETAMINOPHEN 325 MG PO TABS
650.0000 mg | ORAL_TABLET | Freq: Four times a day (QID) | ORAL | Status: AC | PRN
  Administered 2013-02-14 (×3): 650 mg via ORAL
  Filled 2013-02-14 (×3): qty 2

## 2013-02-14 MED ORDER — INSULIN ASPART 100 UNIT/ML ~~LOC~~ SOLN
0.0000 [IU] | SUBCUTANEOUS | Status: DC
  Administered 2013-02-14: 2 [IU] via SUBCUTANEOUS
  Administered 2013-02-14 (×2): 3 [IU] via SUBCUTANEOUS

## 2013-02-14 NOTE — Progress Notes (Signed)
ANTICOAGULATION CONSULT NOTE - Initial Consult  Pharmacy Consult for Heparin Indication: thrombophlebitis  No Known Allergies  Patient Measurements: Height: 5\' 5"  (165.1 cm) Weight: 209 lb 3.5 oz (94.9 kg) IBW/kg (Calculated) : 57 Heparin Dosing Weight: 80 kg   Vital Signs: Temp: 103.1 F (39.5 C) (10/22 0213) Temp src: Oral (10/22 0213) BP: 127/52 mmHg (10/22 0130) Pulse Rate: 96 (10/22 0213)  Labs:  Recent Labs  02/13/13 1951  HGB 15.5*  HCT 43.1  PLT 179  CREATININE 0.61    Estimated Creatinine Clearance: 84.2 ml/min (by C-G formula based on Cr of 0.61).  Assessment: 62 yo female with septic thrombophlebitis for heparin  Goal of Therapy:  Heparin level 0.3-0.7 units/ml Monitor platelets by anticoagulation protocol: Yes   Plan:  Heparin 3000 units IV bolus, then 1250 units/hr Check heparin level in 6 hours.   Davarius Ridener, Gary Fleet 02/14/2013,2:43 AM

## 2013-02-14 NOTE — Consult Note (Signed)
[facility] [place]  Patient name: Marie Stone MRN: [id number] DOB: 10-Feb-1951 Sex: female  REASON FOR CONSULT: phlebitis right thigh  HPI: Marie Stone is a 62 y.o. female who reportedly underwent laser ablation of her right greater saphenous vein and segmental excision of varicose veins of her right thigh approximately 2 weeks ago at a vein center and [place]. She had a follow up visit approximately a week ago and had some bleeding which was addressed with additional laser ablation and injection therapy according to her husband. She continued to have pain in her thigh and was noted today to be confused. She was brought to the emergency department. She had a fever to 103.9. She was felt to be septic. She does have a history of diabetes.  Past Medical History  Diagnosis Date  . Diabetes mellitus without complication   she denies any history of hypertension. She does have hypercholesterolemia. She denies any history of myocardial infarction or history of congestive heart failure.  FAMILY HISTORY: There is no history of premature cardiovascular disease.  SOCIAL HISTORY: History  Substance Use Topics  . Smoking status: Current Every Day Smoker  . Smokeless tobacco: Not on file  . Alcohol Use: No   She smokes 1 pack per day of cigarettes according to her husband.  No Known Allergies  Current Facility-Administered Medications  Medication Dose Route Frequency Provider Last Rate Last Dose  . Ampicillin-Sulbactam (UNASYN) 3 g in sodium chloride 0.9 % 100 mL IVPB  3 g Intravenous Q8H Abran Duke, RPH   3 g at 02/14/13 0045  . atorvastatin (LIPITOR) tablet 10 mg  10 mg Oral QPM Houston Siren, MD      . dextrose 5 %-0.9 % sodium chloride infusion   Intravenous Continuous Houston Siren, MD      . docusate sodium (COLACE) capsule 100 mg  100 mg Oral BID Houston Siren, MD      . heparin injection 5,000 Units  5,000 Units Subcutaneous Q8H Houston Siren, MD      . HYDROmorphone  (DILAUDID) injection 1 mg  1 mg Intravenous Q2H PRN Houston Siren, MD      . insulin aspart (novoLOG) injection 0-15 Units  0-15 Units Subcutaneous Q4H Houston Siren, MD      . ondansetron [facility]) tablet 4 mg  4 mg Oral Q6H PRN Houston Siren, MD       Or  . ondansetron [facility]) injection 4 mg  4 mg Intravenous Q6H PRN Houston Siren, MD   4 mg at 02/14/13 0152  . [START ON 02/15/2013] pneumococcal 23 valent vaccine (PNU-IMMUNE) injection 0.5 mL  0.5 mL Intramuscular Tomorrow-1000 Houston Siren, MD      . sodium chloride 0.9 % injection 3 mL  3 mL Intravenous Q12H Houston Siren, MD      . vancomycin (VANCOCIN) IVPB 1000 mg/200 mL premix  1,000 mg Intravenous Q8H Fredrik Rigger, [facility]       REVIEW OF SYSTEMS: Arly.Keller ] denotes positive finding; [  ] denotes negative finding CARDIOVASCULAR:  [ ]  chest pain   [ ]  chest pressure   [ ]  palpitations   [ ]  orthopnea   [ ]  dyspnea on exertion   [ ]  claudication   [ ]  rest pain   [ ]  DVT   [ ]  phlebitis PULMONARY:   [ ]  productive cough   [ ]  asthma   [ ]  wheezing NEUROLOGIC:   [ ]  weakness  [ ]  paresthesias  [ ]   aphasia  [ ]  amaurosis  [ ]  dizziness HEMATOLOGIC:   [ ]  bleeding problems   [ ]  clotting disorders MUSCULOSKELETAL:  [ ]  joint pain   [ ]  joint swelling [ ]  leg swelling GASTROINTESTINAL: [ ]   blood in stool  [ ]   hematemesis GENITOURINARY:  [ ]   dysuria  [ ]   hematuria PSYCHIATRIC:  [ ]  history of major depression INTEGUMENTARY:  [ ]  rashes  [ ]  ulcers CONSTITUTIONAL:  [ ]  fever   [ ]  chills  PHYSICAL EXAM: Filed Vitals:   02/14/13 0100 02/14/13 0115 02/14/13 0130 02/14/13 0213  BP: 114/49 112/52 127/52   Pulse: 82 88 87 96  Temp:    103.1 F (39.5 C)  TempSrc:    Oral  Resp: 24 21 27  32  Height:    5\' 5"  (1.651 m)  Weight:    209 lb 3.5 oz (94.9 kg)  SpO2: 99% 96% 96% 94%   Body mass index is 34.82 kg/(m^2). GENERAL: The patient is a well-nourished female, in no acute distress. The vital signs are documented above. CARDIOVASCULAR: There is a regular rate and  rhythm. Do not detect carotid bruits. She has palpable femoral, dorsalis pedis, posterior tibial pulses bilaterally. She has moderate right lower extremity swelling. PULMONARY: There is good air exchange bilaterally without wheezing or rales. ABDOMEN: Soft and non-tender with normal pitched bowel sounds.  MUSCULOSKELETAL: There are no major deformities or cyanosis. NEUROLOGIC: No focal weakness or paresthesias are detected. SKIN: She has large truncal varicosities of her right leg low the knee. He has induration in the medial aspect of her right thigh consistent with phlebitis. This is also cellulitic.  DATA:  Lab Results  Component Value Date   WBC 16.1* 02/13/2013   HGB 15.5* 02/13/2013   HCT 43.1 02/13/2013   MCV 88.9 02/13/2013   PLT 179 02/13/2013   Lab Results  Component Value Date   NA 133* 02/13/2013   K 3.3* 02/13/2013   CL 98 02/13/2013   CO2 21 02/13/2013   Lab Results  Component Value Date   CREATININE 0.61 02/13/2013   Venous duplex scan: a tubular complex curvilinear mass was noted in the medial right thigh most likely consistent with a thrombosed tortuous superficial vein.  MEDICAL ISSUES: This patient has thrombophlebitis of the right thigh. She is currently being treated aggressively with intravenous vancomycin and Unasyn. Blood cultures are pending. I would recommend intravenous heparin and leg elevation in addition to warm compresses to the right thigh. I would only consider surgical exploration if her condition deteriorated despite intravenous antibiotics. Main risk with exploration would be significant risk of wound healing problems given her obesity and diabetes. I do not see an indication for excision of her varicose veins. Her mental status has improved since she's been in the hospital and hydrated and also spent started on intravenous antibiotics. We will follow.  Diaz Crago S [facility] [place] Beeper: [phone]

## 2013-02-14 NOTE — Progress Notes (Signed)
ANTICOAGULATION CONSULT NOTE - Initial Consult  Pharmacy Consult for Heparin Indication: thrombophlebitis  No Known Allergies  Patient Measurements: Height: 5\' 5"  (165.1 cm) Weight: 209 lb 3.5 oz (94.9 kg) IBW/kg (Calculated) : 57 Heparin Dosing Weight: 80 kg   Vital Signs: Temp: 101.3 F (38.5 C) (10/22 2016) Temp src: Oral (10/22 2016) BP: 102/44 mmHg (10/22 2016) Pulse Rate: 78 (10/22 2016)  Labs:  Recent Labs  02/13/13 1951 02/14/13 0521 02/14/13 0946 02/14/13 1945  HGB 15.5* 13.2  --  13.0  HCT 43.1 38.7  --  38.2  PLT 179 153  --  134*  HEPARINUNFRC  --   --  <0.10* 0.18*  CREATININE 0.61 0.59  --   --     Estimated Creatinine Clearance: 84.2 ml/min (by C-G formula based on Cr of 0.59).  . sodium chloride 1,000 mL (02/14/13 1548)  . heparin 1,550 Units/hr (02/14/13 2007)     Assessment: 62 yo female with septic thrombophlebitis for heparin. Heparin level remains subtherapeutic. No immediate plans for surgery, some bleeding noted from leg.  Goal of Therapy:  Heparin level 0.3-0.7 units/ml Monitor platelets by anticoagulation protocol: Yes   Plan:  Increase heparin to 1850 units/hr Check heparin level in 6 hours. Continue to monitor for bleeding complications  Estella Husk, Pharm.D., BCPS, AAHIVP Clinical Pharmacist Phone: [phone] or [phone] 02/14/2013, 9:17 PM

## 2013-02-14 NOTE — Progress Notes (Signed)
ANTICOAGULATION CONSULT NOTE - Initial Consult  Pharmacy Consult for Heparin Indication: thrombophlebitis  No Known Allergies  Patient Measurements: Height: 5\' 5"  (165.1 cm) Weight: 209 lb 3.5 oz (94.9 kg) IBW/kg (Calculated) : 57 Heparin Dosing Weight: 80 kg   Vital Signs: Temp: 103.1 F (39.5 C) (10/22 0826) Temp src: Oral (10/22 0826) BP: 132/59 mmHg (10/22 1000) Pulse Rate: 94 (10/22 1100)  Labs:  Recent Labs  02/13/13 1951 02/14/13 0521 02/14/13 0946  HGB 15.5* 13.2  --   HCT 43.1 38.7  --   PLT 179 153  --   HEPARINUNFRC  --   --  <0.10*  CREATININE 0.61 0.59  --     Estimated Creatinine Clearance: 84.2 ml/min (by C-G formula based on Cr of 0.59).  Assessment: 62 yo female with septic thrombophlebitis for heparin. Initial level undetectable. No immediate plans for surgery, no bleeding noted. Will increase rate.   Goal of Therapy:  Heparin level 0.3-0.7 units/ml Monitor platelets by anticoagulation protocol: Yes   Plan:  Increase heparin to 1550 units/hr Check heparin level in 6 hours.  Sheppard Coil PharmD., BCPS Clinical Pharmacist Pager [phone] 02/14/2013 11:36 AM

## 2013-02-14 NOTE — Progress Notes (Signed)
CRITICAL VALUE ALERT  Critical value received: Positive Blood cultures: aerobic & anaerobic gram + cocci clusters  Date of notification:  02/14/2013  Time of notification:  2351  Critical value read back:yes  Nurse who received alert:  Mikinzie Maciejewski. RN  MD notified (1st page):  K.Kirby midlevel provider  Time of first page: 2353  MD notified (2nd page):  Time of second page:  Responding MD:  K.Kirby   Time MD responded:

## 2013-02-14 NOTE — ED Notes (Signed)
Patient transported to Ultrasound 

## 2013-02-14 NOTE — Progress Notes (Signed)
Utilization Review Completed.  

## 2013-02-14 NOTE — Progress Notes (Signed)
[facility] Progress Note [place] TEAM 1 - Stepdown/ICU TEAM   REESHEMAH NAZARYAN ZOX:[id number] DOB: 19-Jan-1951 DOA: 02/13/2013 PCP: Dario Guardian  Admit HPI / Brief Narrative: 62 y.o. female with hx of DM who presented to the ER with altered mental status, rigors, and temp of 103 at home. She apparently had laser ablation of her right greater saphenous vein and segmental excision of varicose veins of her right thigh approximately 2 weeks ago at a vein center here in [place], and developed pain and swelling in the area of her right thigh afterwards. Reportedly she had ultrasound in f/u which questioned "leakage" and she had another procedure yesterday. She had increased pain, couldn't walk, and then began having chills and temp of 103 at home.   Evaluation in the ER showed hypotension with SBP of 80's, which responded to 3L IVF, a leukocytosis with WBC of 16K, and normal renal fx tests. Her lactic acid was elevated. An US of the area showed a complex soft tissue mass lesion thought to represent a dilated tortuous thrombosed venous structure. Her CXR and UA were negative. She was started on IV VAN/IV Unasyn, and the Hospitalists were asked to admit her for sepsis.  Assessment/Plan:  Thrombophelbitis of R thigh   Hypotension  Hypokalemia  DM  Obesity  Code Status: FULL Family Communication: Spoke with patient and husband at bedside Disposition Plan: Step down unit  Consultants: Vasc Surgery  Procedures: none  Antibiotics: Vanc 10/21 >> Unasyn 10/21 >>  DVT prophylaxis: IV heparin   HPI/Subjective: Pt seen for f/u vist  Objective: Blood pressure 101/38, pulse 89, temperature 102.5 F (39.2 C), temperature source Oral, resp. rate 21, height 5\' 5"  (1.651 m), weight 94.9 kg (209 lb 3.5 oz), SpO2 97.00%.  Intake/Output Summary (Last 24 hours) at 02/14/13 1341 Last data filed at 02/14/13 1100  Gross per 24 hour  Intake   1418 ml  Output    175 ml  Net    1243 ml   Exam: F/U exam completed  Data Reviewed: Basic Metabolic Panel:  Recent Labs Lab 02/13/13 1951 02/14/13 0521  NA 133* 136  K 3.3* 2.8*  CL 98 105  CO2 21 20  GLUCOSE 241* 200*  BUN 6 8  CREATININE 0.61 0.59  CALCIUM 8.8 7.6*   Liver Function Tests:  Recent Labs Lab 02/13/13 1951  AST 30  ALT 20  ALKPHOS 110  BILITOT 0.6  PROT 7.2  ALBUMIN 3.4*   No results found for this basename: LIPASE, AMYLASE,  in the last 168 hours No results found for this basename: AMMONIA,  in the last 168 hours CBC:  Recent Labs Lab 02/13/13 1951 02/14/13 0521  WBC 16.1* 11.7*  NEUTROABS 15.3*  --   HGB 15.5* 13.2  HCT 43.1 38.7  MCV 88.9 88.8  PLT 179 153   CBG:  Recent Labs Lab 02/14/13 0454 02/14/13 0829 02/14/13 1003 02/14/13 1138 02/14/13 1312  GLUCAP 199* 140* 162* 160* 138*    Recent Results (from the past 240 hour(s))  MRSA PCR SCREENING     Status: None   Collection Time    02/14/13  2:21 AM      Result Value Range Status   MRSA by PCR NEGATIVE  NEGATIVE Final   Comment:            The GeneXpert MRSA Assay (FDA     approved for NASAL specimens     only), is one component of a  comprehensive MRSA colonization     surveillance program. It is not     intended to diagnose MRSA     infection nor to guide or     monitor treatment for     MRSA infections.     Studies:  Recent x-ray studies have been reviewed in detail by the Attending Physician  Scheduled Meds:  Scheduled Meds: . ampicillin-sulbactam (UNASYN) IV  3 g Intravenous Q8H  . atorvastatin  10 mg Oral QPM  . docusate sodium  100 mg Oral BID  . insulin aspart  0-15 Units Subcutaneous Q4H  . [START ON 02/16/2013] pneumococcal 23 valent vaccine  0.5 mL Intramuscular Tomorrow-1000  . sodium chloride  3 mL Intravenous Q12H  . vancomycin  1,000 mg Intravenous Q8H    Time spent on care of this patient: 25+ mins   [facility] T  [facility] Office   [phone] Pager - Text Page per Loretha Stapler as per below:  On-Call/Text Page:      [email or web address]      password [email or web address]  If 7PM-7AM, please contact night-coverage [email or web address] Password [email or web address] 02/14/2013, 1:41 PM   LOS: 1 day

## 2013-02-14 NOTE — Progress Notes (Signed)
VASCULAR PROGRESS NOTE  SUBJECTIVE: Right thigh pain about the same. Alert and oriented.   PHYSICAL EXAM: Filed Vitals:   02/14/13 0213 02/14/13 0400 02/14/13 0455 02/14/13 0600  BP:  107/51 86/67 104/51  Pulse: 96 91 98 80  Temp: 103.1 F (39.5 C)  101.7 F (38.7 C)   TempSrc: Oral  Oral   Resp: 32 22 27 22   Height: 5\' 5"  (1.651 m)     Weight: 209 lb 3.5 oz (94.9 kg)  209 lb 3.5 oz (94.9 kg)   SpO2: 94% 95% 97% 97%   Right medial thigh indurated.   LABS: Lab Results  Component Value Date   WBC 16.1* 02/13/2013   HGB 15.5* 02/13/2013   HCT 43.1 02/13/2013   MCV 88.9 02/13/2013   PLT 179 02/13/2013   Lab Results  Component Value Date   CREATININE 0.61 02/13/2013   No results found for this basename: INR, PROTIME   CBG (last 3)   Recent Labs  02/14/13 0454  GLUCAP 199*    Principal Problem:   Sepsis associated hypotension Active Problems:   Thrombophlebitis   Diabetes mellitus   Hypotension (arterial)   ASSESSMENT AND PLAN: * Continue IV antibiotics and IV heparin * Leg elevation * Warm compresses * No plans for surgery unless her condition deteriorates.   Cari Caraway Beeper: [phone] 02/14/2013

## 2013-02-14 NOTE — Progress Notes (Signed)
Inpatient Diabetes Program Recommendations  AACE/ADA: New Consensus Statement on Inpatient Glycemic Control (2013)  Target Ranges:  Prepandial:   less than 140 mg/dL      Peak postprandial:   less than 180 mg/dL (1-2 hours)      Critically ill patients:  140 - 180 mg/dL   Reason for Visit: Hyperglycemia  Results for Marie Stone, Marie Stone (MRN [id number]) as of 02/14/2013 15:32  Ref. Range 02/13/2013 19:51 02/14/2013 05:21  Glucose Latest Range: 70-99 mg/dL 782 (H) 956 (H)    Inpatient Diabetes Program Recommendations Correction (SSI): Add HS coverage HgbA1C: Check HgbA1C to assess glycemic control prior to hospitalization  Note: Will follow. Thank you. Ailene Ards, RD, LDN, CDE Inpatient Diabetes Coordinator [phone]

## 2013-02-14 NOTE — Progress Notes (Signed)
Patient had an area on the medial right leg that began oozing a small amount of blood.  Site was assessed and was taut, swollen, and hot, with erythema.  Reinforced the area with 2 x 2 gauze and sent Dr. Sharon Seller a message advising of the status of the area.

## 2013-02-14 NOTE — H&P (Signed)
[facility] History and Physical  RONAE NOELL HQI:[id number] DOB: [birth year]    PCP:   Dario Guardian   Chief Complaint: fever, rigor, and altered mental status.  HPI: Marie Stone is an 62 y.o. female with hx of DM on oral hyperglycemia agent, presents to the ER with altered mental status, rigor, and temp of 103 at home.  She apparently had vein stripping about 3 weeks ago, and developed pain and swelling in the area of her right thigh.  Reportedly, she had ultrasound, which questioned "leakage" and she had another procedure yesterday.  Tonight, she has increase pain, can't walk, having chills, and temp of 103 at home.  Evaluation in the ER showed hypotension with SBP of 80's, responded to 3L IVF, a leukocytosis with WBC of 16K, normal renal fx tests. Her lactic acid was elevated also.  The US of the area showed ? Thrombus in a tortuous soft tissue, infection not excluded.   Her CXR and UA were negative.  She was started on IV VAN/IV Unasyn, and hospitalist was asked to admit her for sepsis, likely from her right thigh from recent surgery.  Rewiew of Systems:  Constitutional:  No significant weight loss or weight gain Eyes: Negative for eye pain, redness and discharge, diplopia, visual changes, or flashes of light. ENMT: Negative for ear pain, hoarseness, nasal congestion, sinus pressure and sore throat. No headaches; tinnitus, drooling, or problem swallowing. Cardiovascular: Negative for chest pain, palpitations, diaphoresis, dyspnea and peripheral edema. ; No orthopnea, PND Respiratory: Negative for cough, hemoptysis, wheezing and stridor. No pleuritic chestpain. Gastrointestinal: Negative for nausea, vomiting, diarrhea, constipation, abdominal pain, melena, blood in stool, hematemesis, jaundice and rectal bleeding.    Genitourinary: Negative for frequency, dysuria, incontinence,flank pain and hematuria; Musculoskeletal: Negative for back pain and neck pain.  Skin: .  Negative for pruritus, rash, abrasions, bruising and skin lesion.; ulcerations Neuro: Negative for headache, lightheadedness and neck stiffness. Negative for weakness, extremity weakness, burning feet, involuntary movement, seizure and syncope.  Psych: negative for anxiety, depression, insomnia, tearfulness, panic attacks, hallucinations, paranoia, suicidal or homicidal ideation    Past Medical History  Diagnosis Date  . Diabetes mellitus without complication     Past Surgical History  Procedure Laterality Date  . Leg surgery      Medications:  HOME MEDS: Prior to Admission medications   Medication Sig Start Date End Date Taking? Authorizing Provider  atorvastatin (LIPITOR) 10 MG tablet Take 10 mg by mouth every evening.   Yes Historical Provider, MD  Canagliflozin (INVOKANA) 100 MG TABS Take 200 mg by mouth daily.   Yes Historical Provider, MD  sitaGLIPtin (JANUVIA) 100 MG tablet Take 100 mg by mouth daily.   Yes Historical Provider, MD     Allergies:  No Known Allergies  Social History:   reports that she has been smoking.  She does not have any smokeless tobacco history on file. She reports that she does not drink alcohol or use illicit drugs.  Family History: No family history on file.   Physical Exam: Filed Vitals:   02/14/13 0045 02/14/13 0100 02/14/13 0115 02/14/13 0130  BP: 108/50 114/49 112/52 127/52  Pulse: 75 82 88 87  Temp: 100.3 F (37.9 C)     TempSrc: Oral     Resp: 20 24 21 27   Height:      Weight:      SpO2: 96% 99% 96% 96%   Blood pressure 127/52, pulse 87, temperature 100.3 F (37.9 C), temperature source  Oral, resp. rate 27, height 5\' 5"  (1.651 m), weight 95.255 kg (210 lb), SpO2 96.00%.  GEN:  Pleasant  patient lying in the stretcher in no acute distress; cooperative with exam. PSYCH:  alert and oriented x4; does not appear anxious or depressed; affect is appropriate. HEENT: Mucous membranes pink and anicteric; PERRLA; EOM intact; no cervical  lymphadenopathy nor thyromegaly or carotid bruit; no JVD; There were no stridor. Neck is very supple. Breasts:: Not examined CHEST WALL: No tenderness CHEST: Normal respiration, clear to auscultation bilaterally.  HEART: Regular rate and rhythm.  There are no murmur, rub, or gallops.   BACK: No kyphosis or scoliosis; no CVA tenderness ABDOMEN: soft and non-tender; no masses, no organomegaly, normal abdominal bowel sounds; no pannus; no intertriginous candida. There is no rebound and no distention. Rectal Exam: Not done EXTREMITIES: No bone or joint deformity; age-appropriate arthropathy of the hands and knees; There is a large 4-6cm induration with some contusion, tender and warm. Genitalia: not examined PULSES: 2+ and symmetric SKIN: Normal hydration no rash or ulceration CNS: Cranial nerves 2-12 grossly intact no focal lateralizing neurologic deficit.  Speech is fluent; uvula elevated with phonation, facial symmetry and tongue midline. DTR are normal bilaterally, cerebella exam is intact, barbinski is negative and strengths are equaled bilaterally.  No sensory loss.   Labs on Admission:  Basic Metabolic Panel:  Recent Labs Lab 02/13/13 1951  NA 133*  K 3.3*  CL 98  CO2 21  GLUCOSE 241*  BUN 6  CREATININE 0.61  CALCIUM 8.8   Liver Function Tests:  Recent Labs Lab 02/13/13 1951  AST 30  ALT 20  ALKPHOS 110  BILITOT 0.6  PROT 7.2  ALBUMIN 3.4*   No results found for this basename: LIPASE, AMYLASE,  in the last 168 hours No results found for this basename: AMMONIA,  in the last 168 hours CBC:  Recent Labs Lab 02/13/13 1951  WBC 16.1*  NEUTROABS 15.3*  HGB 15.5*  HCT 43.1  MCV 88.9  PLT 179   Cardiac Enzymes: No results found for this basename: CKTOTAL, CKMB, CKMBINDEX, TROPONINI,  in the last 168 hours  CBG: No results found for this basename: GLUCAP,  in the last 168 hours   Radiological Exams on Admission: [place] Extrem Low Right Ltd  02/14/2013   CLINICAL  DATA:  Vein stripping 2 weeks ago. Now complaining of a large hard ropy mass.  EXAM: RIGHT LOWER EXTREMITY SOFT TISSUE ULTRASOUND LIMITED  TECHNIQUE: Ultrasound examination was performed including evaluation of the muscles, tendons, joint, and adjacent soft tissues.  COMPARISON:  Ultrasound venous reflux study 12/13/2007  FINDINGS: Ultrasound imaging of the inner posterior right thigh was obtained over the area of palpable abnormality. A tubular complex curvilinear mass lesion is demonstrated in the subcutaneous tissues with heterogeneous internal architecture. Changes likely represent dilated tortuous thrombosed venous structures. Flow is demonstrated in the area on color flow Doppler imaging. Infection is not excluded.  IMPRESSION: Complex soft tissue mass lesion demonstrated corresponding to palpable abnormality. Changes likely to represent dilated tortuous thrombosed venous structures. Infection is not excluded.   Electronically Signed   By: Burman Nieves M.D.   On: 02/14/2013 00:27   Dg Chest Port 1 View  02/13/2013   CLINICAL DATA:  Leg pain  EXAM: PORTABLE CHEST - 1 VIEW  COMPARISON:  CT chest 06/28/2006  FINDINGS: Heart size upper normal. Vascularity normal. Negative for infiltrate effusion or mass.  IMPRESSION: No active disease.   Electronically Signed  By: Marlan Palau M.D.   On: 02/13/2013 22:46   Assessment/Plan Present on Admission:  . Sepsis associated hypotension . Thrombophlebitis . Diabetes mellitus . Hypotension (arterial)  PLAN:  Sepsis, with concerned for septic thromboplebitis. I have asked Dr Edilia Bo of vascular surgery to see her tonight, as she may need an urgent surgical intervention.  In the interim, will admit her to SDU, and provide IVF, along with IV VAN/ IV Unasyn.  Her BP has responded somewhat and currently she is stable.  I have made NPO for her.  For her DM, will hold her meds, and use SSI.  Blood and urine culture has been done.   With respect to  anticoagulation, if she doesn't go to surgery tonight, will consider starting IV heparin.  I will wait for further recommendation from Dr Edilia Bo.  Thank you for letting me participate in her care.  Other plans as per orders.  Code Status: FULL Unk Lightning, MD. [facility] Pager [phone] 7pm to 7am.  02/14/2013, 1:42 AM

## 2013-02-14 NOTE — ED Notes (Signed)
Pt. Returned from ultrasound

## 2013-02-15 DIAGNOSIS — A4101 Sepsis due to Methicillin susceptible Staphylococcus aureus: Secondary | ICD-10-CM | POA: Diagnosis present

## 2013-02-15 DIAGNOSIS — D696 Thrombocytopenia, unspecified: Secondary | ICD-10-CM | POA: Diagnosis present

## 2013-02-15 LAB — GLUCOSE, CAPILLARY
Glucose-Capillary: 143 mg/dL — ABNORMAL HIGH (ref 70–99)
Glucose-Capillary: 128 mg/dL — ABNORMAL HIGH (ref 70–99)
Glucose-Capillary: 129 mg/dL — ABNORMAL HIGH (ref 70–99)
Glucose-Capillary: 133 mg/dL — ABNORMAL HIGH (ref 70–99)

## 2013-02-15 LAB — CBC
MCH: 30.4 pg (ref 26.0–34.0)
MCHC: 33.8 g/dL (ref 30.0–36.0)
MCV: 89.9 fL (ref 78.0–100.0)
Platelets: 123 10*3/uL — ABNORMAL LOW (ref 150–400)
RBC: 4.54 MIL/uL (ref 3.87–5.11)
RDW: 13.5 % (ref 11.5–15.5)

## 2013-02-15 LAB — BASIC METABOLIC PANEL
CO2: 19 mEq/L (ref 19–32)
Calcium: 7.5 mg/dL — ABNORMAL LOW (ref 8.4–10.5)
Creatinine, Ser: 0.56 mg/dL (ref 0.50–1.10)
GFR calc Af Amer: >90 mL/min (ref >90)
Glucose, Bld: 123 mg/dL — ABNORMAL HIGH (ref 70–99)

## 2013-02-15 LAB — URINE CULTURE: Colony Count: NO GROWTH

## 2013-02-15 LAB — HEPARIN LEVEL (UNFRACTIONATED): Heparin Unfractionated: 0.3 IU/mL (ref 0.30–0.70)

## 2013-02-15 LAB — MAGNESIUM: Magnesium: 1.6 mg/dL (ref 1.5–2.5)

## 2013-02-15 MED ORDER — POTASSIUM CHLORIDE CRYS ER 20 MEQ PO TBCR
40.0000 meq | EXTENDED_RELEASE_TABLET | Freq: Once | ORAL | Status: AC
  Administered 2013-02-15: 40 meq via ORAL
  Filled 2013-02-15: qty 2

## 2013-02-15 MED ORDER — CEFAZOLIN SODIUM-DEXTROSE 2-3 GM-% IV SOLR
2.0000 g | Freq: Three times a day (TID) | INTRAVENOUS | Status: DC
  Administered 2013-02-15 – 2013-02-23 (×24): 2 g via INTRAVENOUS
  Filled 2013-02-15 (×29): qty 50

## 2013-02-15 MED ORDER — IBUPROFEN 600 MG PO TABS
600.0000 mg | ORAL_TABLET | Freq: Four times a day (QID) | ORAL | Status: DC
  Administered 2013-02-15 – 2013-02-22 (×27): 600 mg via ORAL
  Filled 2013-02-15 (×34): qty 1

## 2013-02-15 NOTE — Progress Notes (Addendum)
[facility] Progress Note [place] TEAM 1 - Stepdown/ICU TEAM   Marie Stone ZOX:[id number] DOB: [birth year] DOA: 02/13/2013 PCP: Dario Guardian  Admit HPI / Brief Narrative: 62 y.o. female with hx of DM who presented to the ER with altered mental status, rigors, and temp of 103 at home. She apparently had laser ablation of her right greater saphenous vein and segmental excision of varicose veins of her right thigh approximately 2 weeks ago at a vein center here in [place], and developed pain and swelling in the area of her right thigh afterwards. Reportedly she had ultrasound in f/u which questioned "leakage" and she had another procedure yesterday. She had increased pain, couldn't walk, and then began having chills and temp of 103 at home.   Evaluation in the ER showed hypotension with SBP of 80's, which responded to 3L IVF, a leukocytosis with WBC of 16K, and normal renal fx tests. Her lactic acid was elevated. An US of the area showed a complex soft tissue mass lesion thought to represent a dilated tortuous thrombosed venous structure. Her CXR and UA were negative. She was started on IV VAN/IV Unasyn, and the Hospitalists were asked to admit her for sepsis.  Assessment/Plan:  Thrombophelbitis of R thigh - Staph bacteremia sepsis - cont current antibiotics- awaiting final sensitivities - continues to have low grade fevers and severe pain- start oral NSAIDS - currently on heparin but will not need need long term anticoagulation- d/w Dr Edilia Bo - PT eval - f/u on ECHO  Hypotension - resolved  Hypokalemia - replaced  DM Cont current sliding scale  Obesity  Code Status: FULL Family Communication: Spoke with patient and husband at bedside Disposition Plan: transfer out of sdu  Consultants: Vasc Surgery  Procedures: none  Antibiotics: Vanc 10/21 >> Unasyn 10/21 >>10/23 Ancef- 10/23  DVT prophylaxis: IV heparin   HPI/Subjective: Pt having significant  amount of pain in right thigh- just received IV pain medication- numerous questions and concerns addressed.   Objective: Blood pressure 137/57, pulse 83, temperature 100.4 F (38 C), temperature source Oral, resp. rate 21, height 5\' 5"  (1.651 m), weight 94.9 kg (209 lb 3.5 oz), SpO2 98.00%.  Intake/Output Summary (Last 24 hours) at 02/15/13 1524 Last data filed at 02/15/13 0900  Gross per 24 hour  Intake 2108.65 ml  Output    975 ml  Net 1133.65 ml   Exam: GEN: AAO x 3, no obvious distress CHEST: Normal respiration, clear to auscultation bilaterally.  HEART: Regular rate and rhythm. There are no murmur, rub, or gallops.  ABDOMEN: soft and non-tender; no masses, no organomegaly, normal abdominal bowel sounds EXTREMITIES: No bone or joint deformity; age-appropriate arthropathy of the hands and knees; on the right thigh, there is a large 4-6cm induration with some contusion, tender and warm.  CNS: Cranial nerves 2-12 grossly intact no focal lateralizing neurologic deficit. Speech is fluent; uvula elevated with phonation, facial symmetry and tongue midline.    Data Reviewed: Basic Metabolic Panel:  Recent Labs Lab 02/13/13 1951 02/14/13 0521 02/15/13 0350  NA 133* 136 133*  K 3.3* 2.8* 3.0*  CL 98 105 100  CO2 21 20 19   GLUCOSE 241* 200* 123*  BUN 6 8 8   CREATININE 0.61 0.59 0.56  CALCIUM 8.8 7.6* 7.5*  MG  --   --  1.6   Liver Function Tests:  Recent Labs Lab 02/13/13 1951  AST 30  ALT 20  ALKPHOS 110  BILITOT 0.6  PROT 7.2  ALBUMIN 3.4*  No results found for this basename: LIPASE, AMYLASE,  in the last 168 hours No results found for this basename: AMMONIA,  in the last 168 hours CBC:  Recent Labs Lab 02/13/13 1951 02/14/13 0521 02/14/13 1945 02/15/13 0550  WBC 16.1* 11.7* 10.5 11.1*  NEUTROABS 15.3*  --   --   --   HGB 15.5* 13.2 13.0 13.8  HCT 43.1 38.7 38.2 40.8  MCV 88.9 88.8 89.5 89.9  PLT 179 153 134* 123*   CBG:  Recent Labs Lab  02/14/13 1312 02/14/13 1559 02/14/13 2016 02/15/13 0803 02/15/13 1223  GLUCAP 138* 156* 146* 143* 128*    Recent Results (from the past 240 hour(s))  CULTURE, BLOOD (SINGLE)     Status: None   Collection Time    02/13/13 10:30 PM      Result Value Range Status   Specimen Description BLOOD LEFT HAND   Final   Special Requests BOTTLES DRAWN AEROBIC AND ANAEROBIC 10CC   Final   Culture  Setup Time     Final   Value: 02/14/2013 05:54     Performed at [facility]   Culture     Final   Value: STAPHYLOCOCCUS AUREUS     Note: RIFAMPIN AND GENTAMICIN SHOULD NOT BE USED AS SINGLE DRUGS FOR TREATMENT OF STAPH INFECTIONS.     Note: Gram Stain Report Called to,Read Back By and Verified With: KAYLIN REID ON 02/14/2013 AT 11:48P BY WILEJ     Performed at [facility]   Report Status PENDING   Incomplete  URINE CULTURE     Status: None   Collection Time    02/13/13 10:46 PM      Result Value Range Status   Specimen Description URINE, CLEAN CATCH   Final   Special Requests Normal   Final   Culture  Setup Time     Final   Value: 02/14/2013 01:11     Performed at [facility] Count     Final   Value: NO GROWTH     Performed at [facility]   Culture     Final   Value: NO GROWTH     Performed at [facility]   Report Status 02/15/2013 FINAL   Final  MRSA PCR SCREENING     Status: None   Collection Time    02/14/13  2:21 AM      Result Value Range Status   MRSA by PCR NEGATIVE  NEGATIVE Final   Comment:            The GeneXpert MRSA Assay (FDA     approved for NASAL specimens     only), is one component of a     comprehensive MRSA colonization     surveillance program. It is not     intended to diagnose MRSA     infection nor to guide or     monitor treatment for     MRSA infections.     Studies:  Recent x-ray studies have been reviewed in detail by the Attending Physician  Scheduled Meds:  Scheduled Meds: . atorvastatin   10 mg Oral QPM  .  ceFAZolin (ANCEF) IV  2 g Intravenous Q8H  . docusate sodium  100 mg Oral BID  . insulin aspart  0-15 Units Subcutaneous TID WC  . [START ON 02/16/2013] pneumococcal 23 valent vaccine  0.5 mL Intramuscular Tomorrow-1000  . sodium chloride  3 mL Intravenous Q12H  .  vancomycin  1,000 mg Intravenous Q8H    Time spent on care of this patient: 25+ mins   Calvert Cantor, MD  [facility] Office  [phone] Pager - Text Page per Loretha Stapler as per below:  On-Call/Text Page:      [email or web address]      password [email or web address]  If 7PM-7AM, please contact night-coverage [email or web address] Password [email or web address] 02/15/2013, 3:24 PM   LOS: 2 days

## 2013-02-15 NOTE — Progress Notes (Signed)
Right medial thigh is slightly red, tender to touch, skin is very taut and slight bloody drainage.  Message sent to Dr. Sharon Seller to update him of this findings.

## 2013-02-15 NOTE — Progress Notes (Signed)
ANTICOAGULATION CONSULT NOTE - Initial Consult  Pharmacy Consult for Heparin, vancomycin, unasyn Indication: thrombophlebitis/sepsis  No Known Allergies  Patient Measurements: Height: 5\' 5"  (165.1 cm) Weight: 209 lb 3.5 oz (94.9 kg) IBW/kg (Calculated) : 57 Heparin Dosing Weight: 80 kg   Vital Signs: Temp: 98.4 F (36.9 C) (10/23 0752) Temp src: Oral (10/23 0752) BP: 128/62 mmHg (10/23 0752) Pulse Rate: 87 (10/23 0752)  Labs:  Recent Labs  02/13/13 1951 02/14/13 0521 02/14/13 0946 02/14/13 1945 02/15/13 0350 02/15/13 0550  HGB 15.5* 13.2  --  13.0  --  13.8  HCT 43.1 38.7  --  38.2  --  40.8  PLT 179 153  --  134*  --  123*  HEPARINUNFRC  --   --  <0.10* 0.18* 0.30  --   CREATININE 0.61 0.59  --   --  0.56  --     Estimated Creatinine Clearance: 84.2 ml/min (by C-G formula based on Cr of 0.56).  . sodium chloride 1,000 mL (02/15/13 0758)  . heparin 1,850 Units/hr (02/14/13 2120)     Assessment: 1. Septic thrombophlebitis for heparin. Heparin level now at goal after rate adjustment last night. No immediate plans for surgery, some bleeding noted from leg yesterday evening. With the oozing present will continue at current rate today, if low in am will adjust.  2. ID - continues on vancomycin and unasyn; fever of 103 noted overnight, wbc trending up slightly to 11.1 this morning, renal function stable.  10/21 urine - ng 10/21 bld - gpc  Goal of Therapy:  Vancomycin trough 15-20 Heparin level 0.3-0.7 units/ml Monitor platelets by anticoagulation protocol: Yes   Plan:  Continue heparin at 1850 units/hr Daily HL Continue to monitor for bleeding complications Check vancomycin level tonight No change in unasyn  Sheppard Coil PharmD., BCPS Clinical Pharmacist Pager [phone] 02/15/2013 9:01 AM

## 2013-02-15 NOTE — Progress Notes (Signed)
Patient is taking Invokana (Canaglizion) PO at home.  Informed husband not to give this med to the patient since she is getting sliding scale novolog.  Husband and patient verbalized understanding.

## 2013-02-15 NOTE — H&P (Signed)
Marie Stone is an 62 y.o. female.  Plan: Continue vancomycin  Order TTE  Assessment: Staph aureus bacteremia thrombophelbitis  HPI  62 year old female presents with pain, erythema, swelling and bloody discharge in her right medial thigh since her laser surgery & excision of varicose veins on 10/08, with the pain being the most prominent symptom. The erythema has decreased in size and she states that her pain has improved during her hospital stay.  She presented to the ER with fever (103) and chills. Since then, her temperature has ranged between 101 and 103. In addition, she has no systemic complaints such as shortness of breath, abdominal or back pain. She has been put on unasyn and vancomycin. Her past medical history is significant for diabetes mellitus. She has no history of prosthetic device placement or immunosuppression.   Family history: Mother, brother, sister: DM  PMH: Varicose veins Thrombophlebitis DM  Social history: Smokes, 1 pack/day  Allergies: none  Review of Systems  Constitutional: Positive for fever and chills.  HENT: Negative.   Eyes: Negative.   Respiratory: Negative.   Cardiovascular: Negative.   Gastrointestinal: Negative.   Genitourinary: Negative.   Musculoskeletal: Negative.   Skin: Negative.   Neurological: Negative.   Endo/Heme/Allergies: Negative.   other systems unremarkable  Blood pressure 137/57, pulse 83, temperature 100.4 F (38 C), temperature source Oral, resp. rate 21, height 5\' 5"  (1.651 m), weight 94.9 kg (209 lb 3.5 oz), SpO2 98.00%.  Temp: 103 10/22  Physical Exam  Constitutional: She is oriented to person, place, and time. She appears well-developed and well-nourished.  Cardiovascular: Normal rate, regular rhythm and normal heart sounds.   Respiratory: Effort normal and breath sounds normal.  GI: Soft. Bowel sounds are normal.  Neurological: She is alert and oriented to person, place, and time.  Skin: Skin is warm.  There is erythema.   right medial thigh: mild erythema other exams unremarkable  10/22 1945  10/23 0350  10/23 0550  WBC 10.5 11.1 RBC 4.27 4.54 Hemoglobin 13.0 13.8 HCT 38.2 40.8 Platelets 134 123 Sodium 133 Potassium 3.0 Chloride 100 CO2 19 BUN 8 Creatinine 0.56 Calcium 7.5 Glucose 123   Blood culture (routine single) [[id number]] Collected: 02/13/13 2230 Updated: 02/15/13 0944 Specimen Type: Blood Specimen Source: Peripheral Specimen Description BLOOD LEFT HAND Special Requests BOTTLES DRAWN AEROBIC AND ANAEROBIC 10CC Culture Setup Time - Result: 02/14/2013 05:54 Performed at [facility] Culture - Result: STAPHYLOCOCCUS AUREUS Note: RIFAMPIN AND GENTAMICIN SHOULD NOT BE USED AS SINGLE DRUGS FOR TREATMENT OF STAPH INFECTIONS. Note: Gram Stain Report Called to,Read Back By and Verified With: KAYLIN REID ON 02/14/2013 AT 11:48P BY WILEJ Performed at [facility] Report Status PENDING  Urine microscopic-add on [[id number]] (Abnormal) Collected: 02/13/13 2246 Updated: 02/13/13 2325 Squamous Epithelial / LPF MANY (A) RBC / HPF 0-2 RBC/hpf Bacteria, UA FEW (A) Urinalysis, Routine w reflex microscopic [[id number]] (Abnormal) Collected: 02/13/13 2246 Updated: 02/13/13 2325 Specimen Type: Urine Specimen Source: Urine, Clean Catch Color, Urine YELLOW APPearance CLEAR Specific Gravity, Urine 1.022 pH 5.0 Glucose, UA >1000 (A) mg/dL Hgb urine dipstick TRACE (A) Bilirubin Urine NEGATIVE Ketones, ur NEGATIVE mg/dL Protein, ur NEGATIVE mg/dL Urobilinogen, UA 0.2 mg/dL Nitrite NEGATIVE Leukocytes, UA NEGATIVE  Urine culture [[id number]] Collected: 02/13/13 2246 Updated: 02/15/13 0634 Specimen Type: Urine Specimen Source: Urine, Clean Catch Specimen Description URINE, CLEAN CATCH Special Requests Normal Culture Setup Time - Result: 02/14/2013 01:11 Performed at [facility] Count - Result: NO GROWTH Performed at [facility] Culture - Result: NO GROWTH Performed at [facility] Report Status 02/15/2013 FINAL  10/22 [place] right lower extremity IMPRESSION:  Complex soft tissue mass lesion demonstrated corresponding to  palpable abnormality. Changes likely to represent dilated tortuous  thrombosed venous structures. Infection is not excluded.   CXR: 10/21 no active disease     Niurka Benecke 02/15/2013, 1:06 PM

## 2013-02-15 NOTE — Progress Notes (Signed)
  Echocardiogram 2D Echocardiogram has been performed.  Arvil Chaco 02/15/2013, 5:19 PM

## 2013-02-15 NOTE — Progress Notes (Signed)
VASCULAR PROGRESS NOTE  SUBJECTIVE: Pain is better  PHYSICAL EXAM: Filed Vitals:   02/15/13 0600 02/15/13 0752 02/15/13 0800 02/15/13 1221  BP: 131/62 128/62  137/57  Pulse: 81 87 81 83  Temp:  98.4 F (36.9 C)  100.4 F (38 C)  TempSrc:  Oral  Oral  Resp: 26 23 21 21   Height:      Weight:      SpO2: 96% 97% 96% 98%   Less tenderness to right medial thigh. No drainage.  LABS: Lab Results  Component Value Date   WBC 11.1* 02/15/2013   HGB 13.8 02/15/2013   HCT 40.8 02/15/2013   MCV 89.9 02/15/2013   PLT 123* 02/15/2013   Lab Results  Component Value Date   CREATININE 0.56 02/15/2013   No results found for this basename: INR, PROTIME   CBG (last 3)   Recent Labs  02/14/13 2016 02/15/13 0803 02/15/13 1223  GLUCAP 146* 143* 128*    Principal Problem:   Sepsis associated hypotension Active Problems:   Thrombophlebitis   Diabetes mellitus   Hypotension (arterial)   Staphylococcus aureus sepsis   Thrombocytopenia, unspecified   ASSESSMENT AND PLAN:  * Resolving superficial thrombophlebitis of right thigh. According to her husband, the plan is for D/C on IV ABx in a few days. (Blood culture grew Staph)  Cari Caraway Beeper: [phone] 02/15/2013

## 2013-02-15 NOTE — Progress Notes (Signed)
LATE ENTRY:  Patient's medial thigh is red, skin is taut with minimal bruising to incision site. Steri strips is clean, dry and intact.  Site is marked with a marker.

## 2013-02-15 NOTE — H&P (Signed)
[facility]     Reason for Consult: Staph aureus bacteremia    Referring Physician: Dr. Butler Denmark  Principal Problem:   Sepsis associated hypotension Active Problems:   Thrombophlebitis   Diabetes mellitus   Hypotension (arterial)   Staphylococcus aureus sepsis   Thrombocytopenia, unspecified   . atorvastatin  10 mg Oral QPM  . docusate sodium  100 mg Oral BID  . insulin aspart  0-15 Units Subcutaneous TID WC  . [START ON 02/16/2013] pneumococcal 23 valent vaccine  0.5 mL Intramuscular Tomorrow-1000  . sodium chloride  3 mL Intravenous Q12H  . vancomycin  1,000 mg Intravenous Q8H    Recommendations: Continue vancomycin and ancef Repeat blood cultures echo   Assessment: Staph aureus bacteremia   Antibiotics: Vancomycin and unasyn day 2  HPI: Marie Stone is a 62 y.o. female with diabetes, recent varicose vein surgery, vein stipping, who presented yesterday with fever to 104, chills and altered mental status.  Since her surgery she has had some leaking of the area, but developed n/v 2 days ago and somnolence.  She was then brought in to ED and noted fever, hypotension.  WBC of 16K initially and started on vancomycin and unasyn.  [place] of leg with thrombus.  Now with Staph aureus in 1/1 blood culture.     Review of Systems: A comprehensive review of systems was negative.  Past Medical History  Diagnosis Date  . Diabetes mellitus without complication     History  Substance Use Topics  . Smoking status: Current Every Day Smoker  . Smokeless tobacco: Not on file  . Alcohol Use: No    No family history on file. No Known Allergies  OBJECTIVE: Blood pressure 137/57, pulse 83, temperature 100.4 F (38 C), temperature source Oral, resp. rate 21, height 5\' 5"  (1.651 m), weight 209 lb 3.5 oz (94.9 kg), SpO2 98.00%. General: awake, alert, nad Skin: facial erythema, no rash Lungs: CTA B Cor: RRR without m Abdomen: soft, nt, nd Ext: no  edema  Microbiology: Recent Results (from the past 240 hour(s))  CULTURE, BLOOD (SINGLE)     Status: None   Collection Time    02/13/13 10:30 PM      Result Value Range Status   Specimen Description BLOOD LEFT HAND   Final   Special Requests BOTTLES DRAWN AEROBIC AND ANAEROBIC 10CC   Final   Culture  Setup Time     Final   Value: 02/14/2013 05:54     Performed at [facility]   Culture     Final   Value: STAPHYLOCOCCUS AUREUS     Note: RIFAMPIN AND GENTAMICIN SHOULD NOT BE USED AS SINGLE DRUGS FOR TREATMENT OF STAPH INFECTIONS.     Note: Gram Stain Report Called to,Read Back By and Verified With: KAYLIN REID ON 02/14/2013 AT 11:48P BY WILEJ     Performed at [facility]   Report Status PENDING   Incomplete  URINE CULTURE     Status: None   Collection Time    02/13/13 10:46 PM      Result Value Range Status   Specimen Description URINE, CLEAN CATCH   Final   Special Requests Normal   Final   Culture  Setup Time     Final   Value: 02/14/2013 01:11     Performed at [facility]     Final   Value: NO GROWTH     Performed at  [facility]     Final   Value: NO GROWTH     Performed at [facility]   Report Status 02/15/2013 FINAL   Final  MRSA PCR SCREENING     Status: None   Collection Time    02/14/13  2:21 AM      Result Value Range Status   MRSA by PCR NEGATIVE  NEGATIVE Final   Comment:            The GeneXpert MRSA Assay (FDA     approved for NASAL specimens     only), is one component of a     comprehensive MRSA colonization     surveillance program. It is not     intended to diagnose MRSA     infection nor to guide or     monitor treatment for     MRSA infections.    Staci Righter, MD [facility] [email or web address] [phone] pager  [phone] cell 02/15/2013, 2:31 PM

## 2013-02-15 NOTE — ED Provider Notes (Signed)
Shared service with midlevel provider. I have personally seen and examined the patient, providing direct face to face care, presenting with the chief complaint of leg pain and rash. Physical exam findings include erythematous, indurated groin lesion, at the site of the surgical intervention. Pt has SIRs criteria with lactate elevation. Plan will be to admit and treat w/ broad spectrum antibiotics. I have reviewed the nursing documentation on past medical history, family history, and social history.   Derwood Kaplan, MD 02/15/13 0004

## 2013-02-16 DIAGNOSIS — I809 Phlebitis and thrombophlebitis of unspecified site: Secondary | ICD-10-CM | POA: Diagnosis present

## 2013-02-16 LAB — CULTURE, BLOOD (SINGLE)

## 2013-02-16 LAB — CBC
HCT: 36.4 % (ref 36.0–46.0)
Hemoglobin: 12.7 g/dL (ref 12.0–15.0)
MCHC: 34.9 g/dL (ref 30.0–36.0)
MCV: 88.1 fL (ref 78.0–100.0)
RDW: 13.4 % (ref 11.5–15.5)

## 2013-02-16 LAB — GLUCOSE, CAPILLARY
Glucose-Capillary: 118 mg/dL — ABNORMAL HIGH (ref 70–99)
Glucose-Capillary: 123 mg/dL — ABNORMAL HIGH (ref 70–99)

## 2013-02-16 LAB — HEPARIN LEVEL (UNFRACTIONATED): Heparin Unfractionated: 0.32 IU/mL (ref 0.30–0.70)

## 2013-02-16 MED ORDER — BISACODYL 10 MG RE SUPP
10.0000 mg | Freq: Two times a day (BID) | RECTAL | Status: AC
  Administered 2013-02-16: 10 mg via RECTAL
  Filled 2013-02-16: qty 1

## 2013-02-16 MED ORDER — SODIUM CHLORIDE 0.9 % IV SOLN
1250.0000 mg | Freq: Three times a day (TID) | INTRAVENOUS | Status: DC
  Administered 2013-02-16: 1250 mg via INTRAVENOUS
  Filled 2013-02-16 (×3): qty 1250

## 2013-02-16 MED ORDER — POLYETHYLENE GLYCOL 3350 17 G PO PACK
17.0000 g | PACK | Freq: Every day | ORAL | Status: DC
  Administered 2013-02-16 – 2013-02-22 (×7): 17 g via ORAL
  Filled 2013-02-16 (×8): qty 1

## 2013-02-16 MED ORDER — HEPARIN BOLUS VIA INFUSION
3000.0000 [IU] | Freq: Once | INTRAVENOUS | Status: AC
  Administered 2013-02-16: 3000 [IU] via INTRAVENOUS
  Filled 2013-02-16: qty 3000

## 2013-02-16 NOTE — Progress Notes (Addendum)
[facility] Progress Note Sun TEAM 1 - Stepdown/ICU TEAM   JAELEE LAUGHTER ZOX:[id number] DOB: 22-Jul-1950 DOA: 02/13/2013 PCP: Dario Guardian  Admit HPI / Brief Narrative: 62 y.o. female with hx of DM who presented to the ER with altered mental status, rigors, and temp of 103 at home. She apparently had laser ablation of her right greater saphenous vein and segmental excision of varicose veins of her right thigh approximately 2 weeks ago at a vein center here in [place], and developed pain and swelling in the area of her right thigh afterwards. Reportedly she had ultrasound in f/u which questioned "leakage" and she had another procedure yesterday. She had increased pain, couldn't walk, and then began having chills and temp of 103 at home.   Evaluation in the ER showed hypotension with SBP of 80's, which responded to 3L IVF, a leukocytosis with WBC of 16K, and normal renal fx tests. Her lactic acid was elevated. An US of the area showed a complex soft tissue mass lesion thought to represent a dilated tortuous thrombosed venous structure. Her CXR and UA were negative. She was started on IV VAN/IV Unasyn, and the Hospitalists were asked to admit her for sepsis.  Assessment/Plan:  Thrombophelbitis of R thigh - Staph bacteremia sepsis - final sensitivities resulted- will need Ancef for 4 wks - continues to have low grade fevers and severe pain- started oral NSAIDS - currently on heparin but will not need need long term anticoagulation- d/w Dr Edilia Bo - PT eval- no home health needed - f/u on ECHO negative for vegetation  Hypotension - resolved  Hypokalemia - replaced  DM Cont current sliding scale  Obesity  Bloating/ constipation - dulcolax x 2 and daily mirlax  Thrombocytopenia - likely due to sepsis  Code Status: FULL Family Communication: Spoke with patient and husband at bedside Disposition Plan: transfer out of sdu  Consultants: Vasc  Surgery  Procedures: none  Antibiotics: Vanc 10/21 >> Unasyn 10/21 >>10/23 Ancef- 10/23  DVT prophylaxis: IV heparin   HPI/Subjective: Pt ambulated with PT- felt well- bloated- has not had a BM in 4 days and is now having trouble eating and taking deep breaths  Objective: Blood pressure 120/60, pulse 70, temperature 98.4 F (36.9 C), temperature source Oral, resp. rate 17, height 5\' 5"  (1.651 m), weight 94.9 kg (209 lb 3.5 oz), SpO2 95.00%.  Intake/Output Summary (Last 24 hours) at 02/16/13 1421 Last data filed at 02/16/13 0600  Gross per 24 hour  Intake 1138.29 ml  Output      0 ml  Net 1138.29 ml   Exam: GEN: AAO x 3, no obvious distress CHEST: Normal respiration, clear to auscultation bilaterally.  HEART: Regular rate and rhythm. There are no murmur, rub, or gallops.  ABDOMEN: soft and non-tender; no masses, no organomegaly, normal abdominal bowel sounds EXTREMITIES: No bone or joint deformity; age-appropriate arthropathy of the hands and knees; on the right thigh, there is a large 4-6cm induration with some contusion, tender and warm.  CNS: Cranial nerves 2-12 grossly intact no focal lateralizing neurologic deficit. Speech is fluent; uvula elevated with phonation, facial symmetry and tongue midline.    Data Reviewed: Basic Metabolic Panel:  Recent Labs Lab 02/13/13 1951 02/14/13 0521 02/15/13 0350  NA 133* 136 133*  K 3.3* 2.8* 3.0*  CL 98 105 100  CO2 21 20 19   GLUCOSE 241* 200* 123*  BUN 6 8 8   CREATININE 0.61 0.59 0.56  CALCIUM 8.8 7.6* 7.5*  MG  --   --  1.6   Liver Function Tests:  Recent Labs Lab 02/13/13 1951  AST 30  ALT 20  ALKPHOS 110  BILITOT 0.6  PROT 7.2  ALBUMIN 3.4*   No results found for this basename: LIPASE, AMYLASE,  in the last 168 hours No results found for this basename: AMMONIA,  in the last 168 hours CBC:  Recent Labs Lab 02/13/13 1951 02/14/13 0521 02/14/13 1945 02/15/13 0550 02/16/13 0600  WBC 16.1* 11.7* 10.5  11.1* 8.5  NEUTROABS 15.3*  --   --   --   --   HGB 15.5* 13.2 13.0 13.8 12.7  HCT 43.1 38.7 38.2 40.8 36.4  MCV 88.9 88.8 89.5 89.9 88.1  PLT 179 153 134* 123* 122*   CBG:  Recent Labs Lab 02/15/13 1733 02/15/13 2150 02/15/13 2202 02/16/13 0739 02/16/13 1138  GLUCAP 123* 129* 133* 118* 123*    Recent Results (from the past 240 hour(s))  CULTURE, BLOOD (SINGLE)     Status: None   Collection Time    02/13/13 10:30 PM      Result Value Range Status   Specimen Description BLOOD LEFT HAND   Final   Special Requests BOTTLES DRAWN AEROBIC AND ANAEROBIC 10CC   Final   Culture  Setup Time     Final   Value: 02/14/2013 05:54     Performed at [facility]   Culture     Final   Value: STAPHYLOCOCCUS AUREUS     Note: RIFAMPIN AND GENTAMICIN SHOULD NOT BE USED AS SINGLE DRUGS FOR TREATMENT OF STAPH INFECTIONS. This organism is presumed to be Clindamycin resistant based on detection of inducible Clindamycin resistance.     Note: Gram Stain Report Called to,Read Back By and Verified With: KAYLIN REID ON 02/14/2013 AT 11:48P BY Serafina Mitchell     Performed at [facility]   Report Status 02/16/2013 FINAL   Final   Organism ID, Bacteria STAPHYLOCOCCUS AUREUS   Final  URINE CULTURE     Status: None   Collection Time    02/13/13 10:46 PM      Result Value Range Status   Specimen Description URINE, CLEAN CATCH   Final   Special Requests Normal   Final   Culture  Setup Time     Final   Value: 02/14/2013 01:11     Performed at [facility] Count     Final   Value: NO GROWTH     Performed at [facility]   Culture     Final   Value: NO GROWTH     Performed at [facility]   Report Status 02/15/2013 FINAL   Final  MRSA PCR SCREENING     Status: None   Collection Time    02/14/13  2:21 AM      Result Value Range Status   MRSA by PCR NEGATIVE  NEGATIVE Final   Comment:            The GeneXpert MRSA Assay (FDA     approved for NASAL specimens      only), is one component of a     comprehensive MRSA colonization     surveillance program. It is not     intended to diagnose MRSA     infection nor to guide or     monitor treatment for     MRSA infections.     Studies:  Recent x-ray studies have been reviewed in detail by the Attending  Physician  Scheduled Meds:  Scheduled Meds: . atorvastatin  10 mg Oral QPM  . bisacodyl  10 mg Rectal BID  .  ceFAZolin (ANCEF) IV  2 g Intravenous Q8H  . docusate sodium  100 mg Oral BID  . ibuprofen  600 mg Oral Q6H  . insulin aspart  0-15 Units Subcutaneous TID WC  . polyethylene glycol  17 g Oral Daily  . sodium chloride  3 mL Intravenous Q12H    Time spent on care of this patient: 25+ mins   Calvert Cantor, MD  [facility] Office  [phone] Pager - Text Page per Loretha Stapler as per below:  On-Call/Text Page:      [email or web address]      password [email or web address]  If 7PM-7AM, please contact night-coverage [email or web address] Password [email or web address] 02/16/2013, 2:21 PM   LOS: 3 days

## 2013-02-16 NOTE — Progress Notes (Signed)
Patient ID: Marie Stone, female   DOB: [birth year], 62 y.o.   MRN: [id number]  Plan: Continue vancomycin and cefazolin [place] echo and blood cultures  Assessment: Staph aureus bacteremia  Subjectively: She says she is doing well other than the soreness in her right medial thigh. She has had no complaints of fever, chills or sweats or other systemic complaints over night.   Objectively: Temp max over 24 hours: 100.4 Last recorded: 10/24 0551   BP: 104/51 Pulse: 73  Temp: 98.6 F (37 C) Resp: 18  SpO2: 94    CVS: S1+S2+0 Resp: normal vesicular breathing, no added sounds Abdomen: soft, non tender, gs +ve Other exams unremarkable     10/23 0350  10/23 0550  10/24 0600  WBC 11.1 8.5 RBC 4.54 4.13 Hemoglobin 13.8 12.7 HCT 40.8 36.4 Platelets 123 122 Sodium 133 Potassium 3.0 Chloride 100 CO2 19 BUN 8 Creatinine 0.56 Calcium 7.5 Glucose 123

## 2013-02-16 NOTE — Progress Notes (Signed)
[facility]  Date of Admission:  02/13/2013  Antibiotics: Antibiotics Given (last 72 hours)   Date/Time Action Medication Dose Rate   02/14/13 0520 Given   vancomycin (VANCOCIN) IVPB 1000 mg/200 mL premix 1,000 mg 200 mL/hr   02/14/13 1410 Given   vancomycin (VANCOCIN) IVPB 1000 mg/200 mL premix 1,000 mg 200 mL/hr   02/14/13 2218 Given   vancomycin (VANCOCIN) IVPB 1000 mg/200 mL premix 1,000 mg 200 mL/hr   02/15/13 1610 Given   vancomycin (VANCOCIN) IVPB 1000 mg/200 mL premix 1,000 mg 200 mL/hr   02/15/13 1332 Given   vancomycin (VANCOCIN) IVPB 1000 mg/200 mL premix 1,000 mg 200 mL/hr   02/15/13 2027 Given   ceFAZolin (ANCEF) IVPB 2 g/50 mL premix 2 g 100 mL/hr   02/15/13 2324 Given   vancomycin (VANCOCIN) IVPB 1000 mg/200 mL premix 1,000 mg 200 mL/hr   02/16/13 9604 Given   ceFAZolin (ANCEF) IVPB 2 g/50 mL premix 2 g 100 mL/hr   02/16/13 0550 Given   vancomycin (VANCOCIN) 1,250 mg in sodium chloride 0.9 % 250 mL IVPB 1,250 mg 166.7 mL/hr      Subjective: Less pain  Objective: Temp:  [97.5 F (36.4 C)-100.4 F (38 C)] 97.5 F (36.4 C) (10/24 0800) Pulse Rate:  [69-85] 75 (10/24 0800) Resp:  [18-23] 22 (10/24 0800) BP: (104-137)/(51-64) 112/54 mmHg (10/24 0800) SpO2:  [93 %-98 %] 95 % (10/24 0800)  General: Awake, alert nad Skin: no rashes Lungs: CTA B Cor: RRR Abdomen: soft, nt, nd Ext: no edema  Lab Results Lab Results  Component Value Date   WBC 8.5 02/16/2013   HGB 12.7 02/16/2013   HCT 36.4 02/16/2013   MCV 88.1 02/16/2013   PLT 122* 02/16/2013    Lab Results  Component Value Date   CREATININE 0.56 02/15/2013   BUN 8 02/15/2013   NA 133* 02/15/2013   K 3.0* 02/15/2013   CL 100 02/15/2013   CO2 19 02/15/2013    Lab Results  Component Value Date   ALT 20 02/13/2013   AST 30 02/13/2013   ALKPHOS 110 02/13/2013   BILITOT 0.6 02/13/2013      Microbiology: Recent Results (from the past 240 hour(s))  CULTURE,  BLOOD (SINGLE)     Status: None   Collection Time    02/13/13 10:30 PM      Result Value Range Status   Specimen Description BLOOD LEFT HAND   Final   Special Requests BOTTLES DRAWN AEROBIC AND ANAEROBIC 10CC   Final   Culture  Setup Time     Final   Value: 02/14/2013 05:54     Performed at [facility]   Culture     Final   Value: STAPHYLOCOCCUS AUREUS     Note: RIFAMPIN AND GENTAMICIN SHOULD NOT BE USED AS SINGLE DRUGS FOR TREATMENT OF STAPH INFECTIONS. This organism is presumed to be Clindamycin resistant based on detection of inducible Clindamycin resistance.     Note: Gram Stain Report Called to,Read Back By and Verified With: KAYLIN REID ON 02/14/2013 AT 11:48P BY Serafina Mitchell     Performed at [facility]   Report Status 02/16/2013 FINAL   Final   Organism ID, Bacteria STAPHYLOCOCCUS AUREUS   Final  URINE CULTURE     Status: None   Collection Time    02/13/13 10:46 PM      Result Value Range Status   Specimen Description URINE, CLEAN CATCH   Final   Special  Requests Normal   Final   Culture  Setup Time     Final   Value: 02/14/2013 01:11     Performed at [facility]     Final   Value: NO GROWTH     Performed at [facility]   Culture     Final   Value: NO GROWTH     Performed at [facility]   Report Status 02/15/2013 FINAL   Final  MRSA PCR SCREENING     Status: None   Collection Time    02/14/13  2:21 AM      Result Value Range Status   MRSA by PCR NEGATIVE  NEGATIVE Final   Comment:            The GeneXpert MRSA Assay (FDA     approved for NASAL specimens     only), is one component of a     comprehensive MRSA colonization     surveillance program. It is not     intended to diagnose MRSA     infection nor to guide or     monitor treatment for     MRSA infections.    Studies/Results: No results found.  Assessment/Plan: 1) Staph aureus bacteremia with superficial thrombophlebitis of right thigh - MSSA and  now on cefazolin.  Echo noted and no abnormalities, no vegetation.  Repeat blood cultures sent.  -if blood cultures remain negative after 48 hours (Sunday am), can place picc line -I recommend treatment for 4 weeks (with thrombophlebitis) with cefazolin through November 19th as long as repeat blood cultures remain negative -No TEE indicated with prolonged treatment course unless repeat blood cultures turn positive  Dr. Drue Second is available over the weekend if needed, otherwise I will follow up on Monday.    Staci Righter, MD [facility] [email or web address] [phone] pager   [phone] cell 02/16/2013, 11:21 AM

## 2013-02-16 NOTE — Progress Notes (Signed)
ANTICOAGULATION CONSULT NOTE - Follow Up Consult  Pharmacy Consult for heparin Indication: thrombophlebitis  Labs:  Recent Labs  02/13/13 1951 02/14/13 0521  02/14/13 1945 02/15/13 0350 02/15/13 0550 02/16/13 0600  HGB 15.5* 13.2  --  13.0  --  13.8  --   HCT 43.1 38.7  --  38.2  --  40.8  --   PLT 179 153  --  134*  --  123*  --   HEPARINUNFRC  --   --   < > 0.18* 0.30  --  0.15*  CREATININE 0.61 0.59  --   --  0.56  --   --   < > = values in this interval not displayed.   Assessment: 62yo female now subtherapeutic on heparin after one level at low end of goal.  Goal of Therapy:  Heparin level 0.3-0.7 units/ml   Plan:  Will give bolus of heparin 3000 units x1 and increase gtt by 3 units/kg/hr to 2100 units/hr and check level in 6hr.  Vernard Gambles, PharmD, BCPS  02/16/2013,6:56 AM

## 2013-02-16 NOTE — Progress Notes (Signed)
Physical Therapy Evaluation Patient Details Name: Marie Stone MRN: [id number] DOB: 08-02-1950 Today's Date: 02/16/2013 Time: 1020-1039 PT Time Calculation (min): 19 min  PT Assessment / Plan / Recommendation History of Present Illness  Pt admit for sepsis and thrombophlebitis of right thigh.    Clinical Impression  Pt admitted with above. Pt currently with functional limitations due to the deficits listed below (see PT Problem List). Should progress well and need no PT f/u.  Will follow in hospital.   Pt will benefit from skilled PT to increase their independence and safety with mobility to allow discharge to the venue listed below.     PT Assessment  Patient needs continued PT services    Follow Up Recommendations  No PT follow up;Supervision/Assistance - 24 hour                Equipment Recommendations  None recommended by PT         Frequency Min 3X/week    Precautions / Restrictions Precautions Precautions: Fall Restrictions Weight Bearing Restrictions: No   Pertinent Vitals/Pain VSS, some right LE pain      Mobility  Bed Mobility Bed Mobility: Rolling Right;Right Sidelying to Sit;Sitting - Scoot to Edge of Bed Rolling Right: 7: Independent Right Sidelying to Sit: 7: Independent Sitting - Scoot to Edge of Bed: 7: Independent Transfers Transfers: Sit to Stand;Stand to Sit Sit to Stand: 5: Supervision;With upper extremity assist;From bed Stand to Sit: 5: Supervision;With upper extremity assist;To bed Ambulation/Gait Ambulation/Gait Assistance: 4: Min assist Ambulation Distance (Feet): 150 Feet Assistive device: None Ambulation/Gait Assistance Details: Pt able to ambulate in halls.  One LOB which pt self corrected where she had incr weight shift to left totake weight off of right LE secondary to pain.   Gait Pattern: Step-through pattern;Decreased stance time - right;Decreased step length - left;Decreased stride length;Decreased weight shift to  right;Antalgic;Wide base of support Gait velocity: decreased Stairs: No Wheelchair Mobility Wheelchair Mobility: No         PT Diagnosis: Generalized weakness;Acute pain  PT Problem List: Decreased activity tolerance;Decreased balance;Decreased mobility;Decreased knowledge of use of DME;Decreased safety awareness;Pain PT Treatment Interventions: DME instruction;Gait training;Functional mobility training;Therapeutic activities;Therapeutic exercise;Balance training;Patient/family education;Stair training     PT Goals(Current goals can be found in the care plan section) Acute Rehab PT Goals Patient Stated Goal: to go home PT Goal Formulation: With patient Time For Goal Achievement: 02/23/13 Potential to Achieve Goals: Good  Visit Information  Last PT Received On: 02/16/13 Assistance Needed: +1 History of Present Illness: Pt admit for sepsis and thrombophlebitis of right thigh.         Prior Functioning  Home Living Family/patient expects to be discharged to:: Private residence Living Arrangements: Spouse/significant other Available Help at Discharge: Family;Available 24 hours/day Type of Home: House Home Access: Stairs to enter [employer] of Steps: 2 Entrance Stairs-Rails: Right;Left Home Layout: One level Home Equipment: None Additional Comments: Assists daughter in and out of shower  Prior Function Level of Independence: Independent Communication Communication: No difficulties    Cognition  Cognition Arousal/Alertness: Awake/alert Behavior During Therapy: WFL for tasks assessed/performed Overall Cognitive Status: Within Functional Limits for tasks assessed    Extremity/Trunk Assessment Upper Extremity Assessment Upper Extremity Assessment: Defer to OT evaluation Lower Extremity Assessment Lower Extremity Assessment: RLE deficits/detail RLE: Unable to fully assess due to pain   Balance High Level Balance High Level Balance Activites: Direction  changes;Turns;Sudden stops High Level Balance Comments: min guard assist with challenges.  End of  Session PT - End of Session Equipment Utilized During Treatment: Gait belt Activity Tolerance: Patient tolerated treatment well Patient left: in bed;with call bell/phone within reach;with family/visitor present Nurse Communication: Mobility status       INGOLD,Milus Fritze 02/16/2013, 12:34 PM [facility] Acute Rehabilitation [phone] (pager)

## 2013-02-16 NOTE — Progress Notes (Signed)
ANTIBIOTIC CONSULT NOTE - FOLLOW UP  Pharmacy Consult for Vancomycin  Indication: rule out sepsis and staph aureus bacteremia  No Known Allergies  Patient Measurements: Height: 5\' 5"  (165.1 cm) Weight: 209 lb 3.5 oz (94.9 kg) IBW/kg (Calculated) : 57  Vital Signs: Temp: 98.4 F (36.9 C) (10/23 2323) Temp src: Oral (10/23 2323) BP: 110/57 mmHg (10/23 2323) Pulse Rate: 69 (10/23 2323) Intake/Output from previous day: 10/23 0701 - 10/24 0700 In: 975 [I.V.:675; IV Piggyback:300] Out: -  Intake/Output from this shift:    Labs:  Recent Labs  02/13/13 1951 02/14/13 0521 02/14/13 1945 02/15/13 0350 02/15/13 0550  WBC 16.1* 11.7* 10.5  --  11.1*  HGB 15.5* 13.2 13.0  --  13.8  PLT 179 153 134*  --  123*  CREATININE 0.61 0.59  --  0.56  --    Estimated Creatinine Clearance: 84.2 ml/min (by C-G formula based on Cr of 0.56).  Recent Labs  02/15/13 2208  VANCOTROUGH 8.6*     Microbiology: Recent Results (from the past 720 hour(s))  CULTURE, BLOOD (SINGLE)     Status: None   Collection Time    02/13/13 10:30 PM      Result Value Range Status   Specimen Description BLOOD LEFT HAND   Final   Special Requests BOTTLES DRAWN AEROBIC AND ANAEROBIC 10CC   Final   Culture  Setup Time     Final   Value: 02/14/2013 05:54     Performed at [facility]   Culture     Final   Value: STAPHYLOCOCCUS AUREUS     Note: RIFAMPIN AND GENTAMICIN SHOULD NOT BE USED AS SINGLE DRUGS FOR TREATMENT OF STAPH INFECTIONS.     Note: Gram Stain Report Called to,Read Back By and Verified With: KAYLIN REID ON 02/14/2013 AT 11:48P BY WILEJ     Performed at [facility]   Report Status PENDING   Incomplete  URINE CULTURE     Status: None   Collection Time    02/13/13 10:46 PM      Result Value Range Status   Specimen Description URINE, CLEAN CATCH   Final   Special Requests Normal   Final   Culture  Setup Time     Final   Value: 02/14/2013 01:11     Performed at [facility] Count     Final   Value: NO GROWTH     Performed at [facility]   Culture     Final   Value: NO GROWTH     Performed at [facility]   Report Status 02/15/2013 FINAL   Final  MRSA PCR SCREENING     Status: None   Collection Time    02/14/13  2:21 AM      Result Value Range Status   MRSA by PCR NEGATIVE  NEGATIVE Final   Comment:            The GeneXpert MRSA Assay (FDA     approved for NASAL specimens     only), is one component of a     comprehensive MRSA colonization     surveillance program. It is not     intended to diagnose MRSA     infection nor to guide or     monitor treatment for     MRSA infections.    Anti-infectives   Start     Dose/Rate Route Frequency Ordered Stop   02/16/13 0630  vancomycin (VANCOCIN) 1,250 mg in sodium chloride 0.9 % 250 mL IVPB     1,250 mg 166.7 mL/hr over 90 Minutes Intravenous Every 8 hours 02/16/13 0015     02/15/13 1600  ceFAZolin (ANCEF) IVPB 2 g/50 mL premix     2 g 100 mL/hr over 30 Minutes Intravenous 3 times per day 02/15/13 1442     02/14/13 0630  vancomycin (VANCOCIN) IVPB 1000 mg/200 mL premix  Status:  Discontinued     1,000 mg 200 mL/hr over 60 Minutes Intravenous Every 8 hours 02/13/13 2211 02/16/13 0015   02/13/13 2315  Ampicillin-Sulbactam (UNASYN) 3 g in sodium chloride 0.9 % 100 mL IVPB  Status:  Discontinued     3 g 100 mL/hr over 60 Minutes Intravenous Every 8 hours 02/13/13 2312 02/15/13 1418   02/13/13 2200  vancomycin (VANCOCIN) IVPB 1000 mg/200 mL premix     1,000 mg 200 mL/hr over 60 Minutes Intravenous  Once 02/13/13 2153 02/13/13 2348      Assessment: -62 y/o F with r/o sepsis, cellulitis, staph aureus bacteremia -Vancomycin trough is 8.6  Goal of Therapy:  Vancomycin trough level 15-20 mcg/ml  Plan:  -Increase vancomycin to 1250 mg IV q8h (from 1000 mg IV q8h) -Trend WBC, temp, renal function -F/U staph aureus sensi for possible de-escalation   Thank you for  allowing me to take part in this patient's care,  Abran Duke, PharmD Clinical Pharmacist Phone: [phone] Pager: [phone] 02/16/2013 12:22 AM

## 2013-02-16 NOTE — Progress Notes (Signed)
ANTICOAGULATION CONSULT NOTE - Follow-up Consult  Pharmacy Consult for Heparin and Ancef Indication: thrombophlebitis and MSSA bacteremia  No Known Allergies  Patient Measurements: Height: 5\' 5"  (165.1 cm) Weight: 209 lb 3.5 oz (94.9 kg) IBW/kg (Calculated) : 57 Heparin Dosing Weight: 80 kg   Vital Signs: Temp: 98.4 F (36.9 C) (10/24 1147) Temp src: Oral (10/24 1147) BP: 120/60 mmHg (10/24 1147) Pulse Rate: 70 (10/24 1147)  Labs:  Recent Labs  02/13/13 1951 02/14/13 0521  02/14/13 1945 02/15/13 0350 02/15/13 0550 02/16/13 0600 02/16/13 1212  HGB 15.5* 13.2  --  13.0  --  13.8 12.7  --   HCT 43.1 38.7  --  38.2  --  40.8 36.4  --   PLT 179 153  --  134*  --  123* 122*  --   HEPARINUNFRC  --   --   < > 0.18* 0.30  --  0.15* 0.32  CREATININE 0.61 0.59  --   --  0.56  --   --   --   < > = values in this interval not displayed.  Estimated Creatinine Clearance: 84.2 ml/min (by C-G formula based on Cr of 0.56).  . sodium chloride 75 mL/hr at 02/16/13 1146  . heparin 2,100 Units/hr (02/16/13 1146)     Assessment: Anticoagulation: Heparin for thrombophlebitis - resolving per vascular note. Heparin level therapeutic. Per Dr. Edilia Bo - will not need longterm AC.  Infectious Disease: Ancef per Rx for MSSA bacteremia with superficial thrombophlebitis of R thigh. Tmax 100.4. Wbc down to nl. ID rec Ancef x 4 wks (through 11/19) if bld cx remain neg. No TEE indicated unless repeat bld cx positive.   10/21 vanc>>10/24      VT 8.3 10/24 - inc dose 1250mg  IV q8h 10/21 unasyn>>10/23 10/23 ancef>>  10/21 urine - neg 10/21 Bld >>MSSA 10/23 Bld x2>>  Goal of Therapy:  Heparin level 0.3-0.7 units/ml Monitor platelets by anticoagulation protocol: Yes   Plan:  Continue heparin at 2100 units/hr F/u daily heparin level and CBC Continue Ancef 2gm IV q8h F/u repeat blood cultures and renal function  Christoper Fabian, PharmD, BCPS Clinical pharmacist, pager [phone] 02/16/2013  1:49 PM

## 2013-02-17 LAB — CBC
HCT: 34.9 % — ABNORMAL LOW (ref 36.0–46.0)
MCH: 31 pg (ref 26.0–34.0)
MCHC: 35.2 g/dL (ref 30.0–36.0)
MCV: 87.9 fL (ref 78.0–100.0)
Platelets: 141 10*3/uL — ABNORMAL LOW (ref 150–400)
RDW: 13.4 % (ref 11.5–15.5)

## 2013-02-17 LAB — HEPARIN LEVEL (UNFRACTIONATED): Heparin Unfractionated: 0.13 IU/mL — ABNORMAL LOW (ref 0.30–0.70)

## 2013-02-17 LAB — GLUCOSE, CAPILLARY
Glucose-Capillary: 139 mg/dL — ABNORMAL HIGH (ref 70–99)
Glucose-Capillary: 120 mg/dL — ABNORMAL HIGH (ref 70–99)
Glucose-Capillary: 169 mg/dL — ABNORMAL HIGH (ref 70–99)

## 2013-02-17 MED ORDER — HEPARIN BOLUS VIA INFUSION
2400.0000 [IU] | Freq: Once | INTRAVENOUS | Status: AC
  Administered 2013-02-17: 2400 [IU] via INTRAVENOUS
  Filled 2013-02-17: qty 2400

## 2013-02-17 MED ORDER — HEPARIN BOLUS VIA INFUSION
3000.0000 [IU] | Freq: Once | INTRAVENOUS | Status: AC
  Administered 2013-02-17: 3000 [IU] via INTRAVENOUS
  Filled 2013-02-17: qty 3000

## 2013-02-17 NOTE — Progress Notes (Signed)
Pt. Complaining of unable to fully empty bladder. Patient voided 50cc. Poist void residual per bladder scanner is >200. I&O cath done. Output=400 cc. Pt. Tolerated procedure well. Will continue to monitor, [employer] RN

## 2013-02-17 NOTE — Progress Notes (Signed)
VASCULAR PROGRESS NOTE  SUBJECTIVE: Moderate right thigh discomfort.  PHYSICAL EXAM: Filed Vitals:   02/16/13 1514 02/16/13 1624 02/16/13 2056 02/17/13 0636  BP: 135/61 140/60 131/58 151/85  Pulse: 90 85 85 88  Temp: 99.5 F (37.5 C) 99.3 F (37.4 C) 99.5 F (37.5 C) 98.3 F (36.8 C)  TempSrc: Oral Oral Oral Oral  Resp: 23 20 18 18   Height:      Weight:      SpO2: 94% 94% 94% 94%   Some old bloody drainage on dressing on right thigh. I removed the steri-trips. I do not see an open wound.  Phlebitis is unchanged.  LABS: Lab Results  Component Value Date   WBC 8.3 02/17/2013   HGB 12.3 02/17/2013   HCT 34.9* 02/17/2013   MCV 87.9 02/17/2013   PLT 141* 02/17/2013   Lab Results  Component Value Date   CREATININE 0.56 02/15/2013   CBG (last 3)   Recent Labs  02/16/13 1138 02/16/13 1652 02/16/13 2054  GLUCAP 123* 156* 168*    Principal Problem:   Sepsis associated hypotension Active Problems:   Diabetes mellitus   Hypotension (arterial)   Staphylococcus aureus sepsis   Thrombocytopenia, unspecified   Septic thrombophlebitis   ASSESSMENT AND PLAN:  * No plans for  Any surgical intervention * Agree with plans for continued IV ABx (Ancef) as an outpt for 4 weeks * I will arrange outpt follow up with [place].   Cari Caraway Beeper: [phone] 02/17/2013

## 2013-02-17 NOTE — Progress Notes (Signed)
ANTICOAGULATION CONSULT NOTE - Follow Up Consult  Pharmacy Consult for heparin Indication: thrombophlebitis  Labs:  Recent Labs  02/15/13 0350 02/15/13 0550 02/16/13 0600 02/16/13 1212 02/17/13 0419  HGB  --  13.8 12.7  --  12.3  HCT  --  40.8 36.4  --  34.9*  PLT  --  123* 122*  --  141*  HEPARINUNFRC 0.30  --  0.15* 0.32 0.13*  CREATININE 0.56  --   --   --   --      Assessment: 62yo female now subtherapeutic on heparin after one level at low end of goal with difficulty maintaining goal levels.  Goal of Therapy:  Heparin level 0.3-0.7 units/ml   Plan:  Will give bolus of heparin 3000 units x1 and increase gtt by 3 units/kg/hr to 2400 units/hr and check level in 6hr.  Vernard Gambles, PharmD, BCPS  02/17/2013,6:33 AM

## 2013-02-17 NOTE — Progress Notes (Signed)
ANTICOAGULATION CONSULT NOTE - Follow Up Consult  Pharmacy Consult for Heparin Indication: thrombophlebitis  No Known Allergies  Patient Measurements: Height: 5\' 5"  (165.1 cm) Weight: 209 lb 3.5 oz (94.9 kg) IBW/kg (Calculated) : 57 Heparin Dosing Weight: 80.6 kg  Vital Signs: Temp: 98.6 F (37 C) (10/25 1427) Temp src: Oral (10/25 1427) BP: 143/62 mmHg (10/25 1427) Pulse Rate: 72 (10/25 1427)  Labs:  Recent Labs  02/15/13 0350 02/15/13 0550 02/16/13 0600 02/16/13 1212 02/17/13 0419 02/17/13 1300  HGB  --  13.8 12.7  --  12.3  --   HCT  --  40.8 36.4  --  34.9*  --   PLT  --  123* 122*  --  141*  --   HEPARINUNFRC 0.30  --  0.15* 0.32 0.13* 0.19*  CREATININE 0.56  --   --   --   --   --     Estimated Creatinine Clearance: 84.2 ml/min (by C-G formula based on Cr of 0.56).  Assessment: 62yo diabetic female presents to the ED with fever, leukocytosis, and right leg pain (s/p vein surgery 3 weeks ago). She will begin empiric vancomycin for probable sepsis. Renal function wnl.  Anticoagulation: Heparin for thrombophlebitis - resolving per vascular note. HL 0.19, low again. Per Dr. Edilia Bo - will not need longterm AC.  Goal of Therapy:  Heparin level 0.3-0.7 units/ml Monitor platelets by anticoagulation protocol: Yes   Plan:  Heparin 2400 unit IV bolus Increase infusion to 2700 units/hr Recheck level again in 7 hrs. Daily heparin level and CBC  Matalie Romberger S. Merilynn Finland, PharmD, BCPS Clinical Staff Pharmacist Pager [phone]  Misty Stanley Stillinger 02/17/2013,4:05 PM

## 2013-02-17 NOTE — Progress Notes (Signed)
[facility] PROGRESS NOTE  AUSTRALIA DROLL ZOX:[id number] DOB: 07-31-1950 DOA: 02/13/2013 PCP: Dario Guardian  Assessment/Plan: 1. 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 Patient was seen and evaluated by infectious disease, plan for 4 weeks of Ancef therapy. Blood cultures have been repeated, it remained sterile for 48 hours will place PICC line. Of note no vegetations were noted on echo. 2. Diabetes mellitus. Blood sugar stable, continue Accu-Cheks q. a.c. each bedtime with sliding scale coverage 3. Thrombophlebitis of right thigh. Patient on heparin 4. Dyslipidemia. Continue statin therapy  Code Status: Full code Family Communication: Plan discussed with patient Disposition Plan: Plan patient undergo PICC line placement if blood cultures remained sterile by tomorrow   Consultants: Infectious disease Vascular surgery  Procedures:  Transthoracic echocardiogram performed on 02/12/2013 impression: Left ventricular ejection fraction estimated at 50-55%, without wall motion abnormalities, vegetation is absent  Antibiotics:  Ancef  HPI/Subjective: Patient reporting ongoing right thigh pain, unchanged since yesterday. Otherwise she reports tolerating by mouth intake, having regular bowel movements.  Objective: Filed Vitals:   02/17/13 0636  BP: 151/85  Pulse: 88  Temp: 98.3 F (36.8 C)  Resp: 18    Intake/Output Summary (Last 24 hours) at 02/17/13 1406 Last data filed at 02/17/13 0600  Gross per 24 hour  Intake   1420 ml  Output      0 ml  Net   1420 ml   Filed Weights   02/13/13 2145 02/14/13 0213 02/14/13 0455  Weight: 95.255 kg (210 lb) 94.9 kg (209 lb 3.5 oz) 94.9 kg (209 lb 3.5 oz)    Exam:   General:  Patient is in no acute distress she is awake alert  Cardiovascular: Regular rate and rhythm  normal S1 is  Respiratory: Lungs are clear to auscultation bilaterally  Abdomen: Soft nontender nondistended positive bowel  Musculoskeletal: Patient has erythema and some swelling o, region is painful to palpationver her right thigh   Data Reviewed: Basic Metabolic Panel:  Recent Labs Lab 02/13/13 1951 02/14/13 0521 02/15/13 0350  NA 133* 136 133*  K 3.3* 2.8* 3.0*  CL 98 105 100  CO2 21 20 19   GLUCOSE 241* 200* 123*  BUN 6 8 8   CREATININE 0.61 0.59 0.56  CALCIUM 8.8 7.6* 7.5*  MG  --   --  1.6   Liver Function Tests:  Recent Labs Lab 02/13/13 1951  AST 30  ALT 20  ALKPHOS 110  BILITOT 0.6  PROT 7.2  ALBUMIN 3.4*   No results found for this basename: LIPASE, AMYLASE,  in the last 168 hours No results found for this basename: AMMONIA,  in the last 168 hours CBC:  Recent Labs Lab 02/13/13 1951 02/14/13 0521 02/14/13 1945 02/15/13 0550 02/16/13 0600 02/17/13 0419  WBC 16.1* 11.7* 10.5 11.1* 8.5 8.3  NEUTROABS 15.3*  --   --   --   --   --   HGB 15.5* 13.2 13.0 13.8 12.7 12.3  HCT 43.1 38.7 38.2 40.8 36.4 34.9*  MCV 88.9 88.8 89.5 89.9 88.1 87.9  PLT 179 153 134* 123* 122* 141*   Cardiac Enzymes: No results found for this basename: CKTOTAL, CKMB, CKMBINDEX, TROPONINI,  in the last 168 hours BNP (last 3 results) No results found for this basename: PROBNP,  in the last 8760 hours CBG:  Recent Labs Lab 02/16/13 1138 02/16/13 1652 02/16/13 2054 02/17/13 0737 02/17/13 1058  GLUCAP 123* 156* 168* 113* 139*    Recent Results (from the past 240 hour(s))  CULTURE, BLOOD (SINGLE)     Status: None   Collection Time    02/13/13 10:30 PM      Result Value Range Status   Specimen Description BLOOD LEFT HAND   Final   Special Requests BOTTLES DRAWN AEROBIC AND ANAEROBIC 10CC   Final   Culture  Setup Time     Final   Value: 02/14/2013 05:54     Performed at [facility]   Culture     Final   Value: STAPHYLOCOCCUS AUREUS     Note: RIFAMPIN AND  GENTAMICIN SHOULD NOT BE USED AS SINGLE DRUGS FOR TREATMENT OF STAPH INFECTIONS. This organism is presumed to be Clindamycin resistant based on detection of inducible Clindamycin resistance.     Note: Gram Stain Report Called to,Read Back By and Verified With: KAYLIN REID ON 02/14/2013 AT 11:48P BY Serafina Mitchell     Performed at [facility]   Report Status 02/16/2013 FINAL   Final   Organism ID, Bacteria STAPHYLOCOCCUS AUREUS   Final  URINE CULTURE     Status: None   Collection Time    02/13/13 10:46 PM      Result Value Range Status   Specimen Description URINE, CLEAN CATCH   Final   Special Requests Normal   Final   Culture  Setup Time     Final   Value: 02/14/2013 01:11     Performed at [facility] Count     Final   Value: NO GROWTH     Performed at [facility]   Culture     Final   Value: NO GROWTH     Performed at [facility]   Report Status 02/15/2013 FINAL   Final  MRSA PCR SCREENING     Status: None   Collection Time    02/14/13  2:21 AM      Result Value Range Status   MRSA by PCR NEGATIVE  NEGATIVE Final   Comment:            The GeneXpert MRSA Assay (FDA     approved for NASAL specimens     only), is one component of a     comprehensive MRSA colonization     surveillance program. It is not     intended to diagnose MRSA     infection nor to guide or     monitor treatment for     MRSA infections.  CULTURE, BLOOD (ROUTINE X 2)     Status: None   Collection Time    02/15/13  3:23 PM      Result Value Range Status   Specimen Description BLOOD RIGHT FOREARM   Final   Special Requests BOTTLES DRAWN AEROBIC ONLY 10CC   Final   Culture  Setup Time     Final   Value: 02/16/2013 01:44     Performed at [facility]   Culture     Final   Value:        BLOOD CULTURE RECEIVED NO GROWTH TO DATE CULTURE WILL BE HELD FOR 5 DAYS BEFORE ISSUING A FINAL NEGATIVE REPORT     Performed at [facility]   Report Status PENDING    Incomplete  CULTURE, BLOOD (ROUTINE X 2)     Status: None   Collection Time    02/15/13  3:30 PM  Result Value Range Status   Specimen Description BLOOD RIGHT HAND   Final   Special Requests BOTTLES DRAWN AEROBIC ONLY 3CC   Final   Culture  Setup Time     Final   Value: 02/16/2013 01:44     Performed at [facility]   Culture     Final   Value:        BLOOD CULTURE RECEIVED NO GROWTH TO DATE CULTURE WILL BE HELD FOR 5 DAYS BEFORE ISSUING A FINAL NEGATIVE REPORT     Performed at [facility]   Report Status PENDING   Incomplete     Studies: No results found.  Scheduled Meds: . atorvastatin  10 mg Oral QPM  .  ceFAZolin (ANCEF) IV  2 g Intravenous Q8H  . docusate sodium  100 mg Oral BID  . ibuprofen  600 mg Oral Q6H  . insulin aspart  0-15 Units Subcutaneous TID WC  . polyethylene glycol  17 g Oral Daily  . sodium chloride  3 mL Intravenous Q12H   Continuous Infusions: . sodium chloride 75 mL/hr at 02/17/13 0439  . heparin 2,400 Units/hr (02/17/13 0646)    Principal Problem:   Sepsis associated hypotension Active Problems:   Diabetes mellitus   Hypotension (arterial)   Staphylococcus aureus sepsis   Thrombocytopenia, unspecified   Septic thrombophlebitis    Time spent: 35 minutes    Marie Stone  [facility] Pager [phone]. If 7PM-7AM, please contact night-coverage at [email or web address], password [facility] 02/17/2013, 2:06 PM  LOS: 4 days

## 2013-02-17 NOTE — Progress Notes (Signed)
Was helping pt. To the bathroom and when she got up to wash her hands she lost her balance and took a step back.  I asked if that was a recurring thing for her and she said that she does that at home some.  I turned her bed alarm on.  Will con't to monitor. Vanice Sarah

## 2013-02-18 LAB — HEPARIN LEVEL (UNFRACTIONATED)
Heparin Unfractionated: 0.51 IU/mL (ref 0.30–0.70)
Heparin Unfractionated: 0.66 IU/mL (ref 0.30–0.70)
Heparin Unfractionated: 0.71 IU/mL — ABNORMAL HIGH (ref 0.30–0.70)

## 2013-02-18 LAB — CBC
Hemoglobin: 12.4 g/dL (ref 12.0–15.0)
MCH: 30.8 pg (ref 26.0–34.0)
Platelets: 191 10*3/uL (ref 150–400)
RBC: 4.03 MIL/uL (ref 3.87–5.11)
RDW: 13.5 % (ref 11.5–15.5)
WBC: 9.6 10*3/uL (ref 4.0–10.5)

## 2013-02-18 LAB — GLUCOSE, CAPILLARY
Glucose-Capillary: 118 mg/dL — ABNORMAL HIGH (ref 70–99)
Glucose-Capillary: 133 mg/dL — ABNORMAL HIGH (ref 70–99)
Glucose-Capillary: 131 mg/dL — ABNORMAL HIGH (ref 70–99)

## 2013-02-18 NOTE — Progress Notes (Signed)
ANTICOAGULATION CONSULT NOTE - Follow Up Consult  Pharmacy Consult for heparin Indication: thrombophlebitis  Labs:  Recent Labs  02/15/13 0350  02/15/13 0550 02/16/13 0600  02/17/13 0419 02/17/13 1300 02/18/13 0020  HGB  --   < > 13.8 12.7  --  12.3  --   --   HCT  --   --  40.8 36.4  --  34.9*  --   --   PLT  --   --  123* 122*  --  141*  --   --   HEPARINUNFRC 0.30  --   --  0.15*  < > 0.13* 0.19* 0.51  CREATININE 0.56  --   --   --   --   --   --   --   < > = values in this interval not displayed.   Assessment/Plan: 62yo female therapeutic on heparin after rate increases with difficulty maintaining goal levels.  Will continue gtt at current rate and confirm stable with additional level.  Vernard Gambles, PharmD, BCPS  02/18/2013,1:19 AM

## 2013-02-18 NOTE — Progress Notes (Signed)
ANTICOAGULATION CONSULT NOTE - Follow Up Consult  Pharmacy Consult for Heparin Indication: thrombophlebitis  No Known Allergies  Patient Measurements: Height: 5\' 5"  (165.1 cm) Weight: 209 lb 3.5 oz (94.9 kg) IBW/kg (Calculated) : 57 Heparin Dosing Weight: 80 kg  Vital Signs: Temp: 98.9 F (37.2 C) (10/26 1400) Temp src: Oral (10/26 1400) BP: 153/65 mmHg (10/26 1400) Pulse Rate: 76 (10/26 1400)  Labs:  Recent Labs  02/16/13 0600  02/17/13 0419  02/18/13 0020 02/18/13 0900 02/18/13 1810  HGB 12.7  --  12.3  --   --  12.4  --   HCT 36.4  --  34.9*  --   --  35.1*  --   PLT 122*  --  141*  --   --  191  --   HEPARINUNFRC 0.15*  < > 0.13*  < > 0.51 0.71* 0.66  < > = values in this interval not displayed.  Estimated Creatinine Clearance: 84.2 ml/min (by C-G formula based on Cr of 0.56).   Medications:  Infusions:  . sodium chloride 75 mL/hr at 02/18/13 0052  . heparin 2,550 Units/hr (02/18/13 1210)    Assessment: 62 year old female receiving anticoagulation with Heparin for thrombophlebitis.  It has been difficult to achieve therapeutic anticoagulation. Heparin level 0.71 now in goal at 0.66  Goal of Therapy:  Heparin level 0.3-0.7 units/ml Monitor platelets by anticoagulation protocol: Yes   Plan: Continue Heparin to 2550 units/hr   Hadassah Rana S. Merilynn Finland, PharmD, BCPS Clinical Staff Pharmacist Pager [phone]  02/18/2013, 7:14 PM

## 2013-02-18 NOTE — Progress Notes (Signed)
VASCULAR PROGRESS NOTE  SUBJECTIVE: Right thigh pain improved somewhat.  PHYSICAL EXAM: Filed Vitals:   02/17/13 0636 02/17/13 1427 02/17/13 2127 02/18/13 0634  BP: 151/85 143/62 156/73 155/72  Pulse: 88 72 94 80  Temp: 98.3 F (36.8 C) 98.6 F (37 C) 98.8 F (37.1 C) 98.5 F (36.9 C)  TempSrc: Oral Oral Oral Oral  Resp: 18 20 18 16   Height:      Weight:      SpO2: 94% 94% 95% 96%   Some old bloody drainage from a small hole where she presumably had a stab evulsion of a varicose vein. I was unable to get the head of a q-tip into the hole.   LABS: Lab Results  Component Value Date   WBC 8.3 02/17/2013   HGB 12.3 02/17/2013   HCT 34.9* 02/17/2013   MCV 87.9 02/17/2013   PLT 141* 02/17/2013   Lab Results  Component Value Date   CREATININE 0.56 02/15/2013   No results found for this basename: INR, PROTIME   CBG (last 3)   Recent Labs  02/17/13 1058 02/17/13 1707 02/17/13 2126  GLUCAP 139* 120* 169*    Principal Problem:   Sepsis associated hypotension Active Problems:   Diabetes mellitus   Hypotension (arterial)   Staphylococcus aureus sepsis   Thrombocytopenia, unspecified   Septic thrombophlebitis   ASSESSMENT AND PLAN: * Continue to cover the area that is draining with dry gauze. If she is discharged, this can be done as an outptient.  * continues IV Ancef   Cari Caraway Beeper: [phone] 02/18/2013

## 2013-02-18 NOTE — Progress Notes (Signed)
ANTICOAGULATION CONSULT NOTE - Follow Up Consult  Pharmacy Consult for Heparin Indication: thrombophlebitis  No Known Allergies  Patient Measurements: Height: 5\' 5"  (165.1 cm) Weight: 209 lb 3.5 oz (94.9 kg) IBW/kg (Calculated) : 57 Heparin Dosing Weight: 80 kg  Vital Signs: Temp: 98.5 F (36.9 C) (10/26 0634) Temp src: Oral (10/26 0634) BP: 155/72 mmHg (10/26 0634) Pulse Rate: 80 (10/26 0634)  Labs:  Recent Labs  02/16/13 0600  02/17/13 0419 02/17/13 1300 02/18/13 0020 02/18/13 0900  HGB 12.7  --  12.3  --   --  12.4  HCT 36.4  --  34.9*  --   --  35.1*  PLT 122*  --  141*  --   --  191  HEPARINUNFRC 0.15*  < > 0.13* 0.19* 0.51 0.71*  < > = values in this interval not displayed.  Estimated Creatinine Clearance: 84.2 ml/min (by C-G formula based on Cr of 0.56).   Medications:  Infusions:  . sodium chloride 75 mL/hr at 02/18/13 0052  . heparin 2,700 Units/hr (02/18/13 0600)    Assessment: 62 year old female receiving anticoagulation with Heparin for thrombophlebitis.  It has been difficult to achieve therapeutic anticoagulation, and she is now supratherapeutic after multiple rate adjustments.  Goal of Therapy:  Heparin level 0.3-0.7 units/ml Monitor platelets by anticoagulation protocol: Yes   Plan:  Decrease Heparin to 2550 units/hr Recheck Heparin level in 6 hours  Estella Husk, Pharm.D., BCPS, AAHIVP Clinical Pharmacist Phone: [phone] or [phone] 02/18/2013, 11:04 AM

## 2013-02-18 NOTE — Progress Notes (Signed)
[facility] PROGRESS NOTE  Marie Stone HQI:[id number] DOB: 01-05-1951 DOA: 02/13/2013 PCP: Dario Guardian  Assessment/Plan: 1. 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 Blood cultures followed, remained negative for which a PICC line will be ordered today. 2. Diabetes mellitus. Blood sugar stable, continue Accu-Cheks q. a.c. each bedtime with sliding scale coverage 3. Thrombophlebitis of right thigh. Patient on heparin 4. Dyslipidemia. Continue statin therapy  Code Status: Full code Family Communication: Plan discussed with patient Disposition Plan: Plan for PICC line placement, likely be discharged the next 24 hours.   Consultants: Infectious disease Vascular surgery  Procedures:  Transthoracic echocardiogram performed on 02/12/2013 impression: Left ventricular ejection fraction estimated at 50-55%, without wall motion abnormalities, vegetation is absent  Antibiotics:  Ancef  HPI/Subjective: Patient ambulated down the hallway today, tolerating by mouth intake, also complains of ongoing right thigh pain. Plan for PICC line placement with blood cultures remaining negative.  Objective: Filed Vitals:   02/18/13 1400  BP: 153/65  Pulse: 76  Temp: 98.9 F (37.2 C)  Resp: 18    Intake/Output Summary (Last 24 hours) at 02/18/13 1831 Last data filed at 02/18/13 0900  Gross per 24 hour  Intake   1704 ml  Output    400 ml  Net   1304 ml   Filed Weights   02/13/13 2145 02/14/13 0213 02/14/13 0455  Weight: 95.255 kg (210 lb) 94.9 kg (209 lb 3.5 oz) 94.9 kg (209 lb 3.5 oz)    Exam:   General:  Patient is in no acute distress she is awake alert  Cardiovascular: Regular rate and rhythm normal S1 is  Respiratory: Lungs are clear to auscultation bilaterally  Abdomen: Soft nontender nondistended positive  bowel  Musculoskeletal: Patient has erythema and some swelling o, region is painful to palpationver her right thigh   Data Reviewed: Basic Metabolic Panel:  Recent Labs Lab 02/13/13 1951 02/14/13 0521 02/15/13 0350  NA 133* 136 133*  K 3.3* 2.8* 3.0*  CL 98 105 100  CO2 21 20 19   GLUCOSE 241* 200* 123*  BUN 6 8 8   CREATININE 0.61 0.59 0.56  CALCIUM 8.8 7.6* 7.5*  MG  --   --  1.6   Liver Function Tests:  Recent Labs Lab 02/13/13 1951  AST 30  ALT 20  ALKPHOS 110  BILITOT 0.6  PROT 7.2  ALBUMIN 3.4*   No results found for this basename: LIPASE, AMYLASE,  in the last 168 hours No results found for this basename: AMMONIA,  in the last 168 hours CBC:  Recent Labs Lab 02/13/13 1951  02/14/13 1945 02/15/13 0550 02/16/13 0600 02/17/13 0419 02/18/13 0900  WBC 16.1*  < > 10.5 11.1* 8.5 8.3 9.6  NEUTROABS 15.3*  --   --   --   --   --   --   HGB 15.5*  < > 13.0 13.8 12.7 12.3 12.4  HCT 43.1  < > 38.2 40.8 36.4 34.9* 35.1*  MCV 88.9  < > 89.5 89.9 88.1 87.9 87.1  PLT 179  < > 134* 123* 122* 141* 191  < > = values in this interval not displayed. Cardiac Enzymes: No results found for this basename: CKTOTAL, CKMB, CKMBINDEX, TROPONINI,  in the last 168 hours BNP (last 3 results) No results found for this basename: PROBNP,  in the last 8760 hours CBG:  Recent Labs Lab 02/17/13 1707 02/17/13  2126 02/18/13 0754 02/18/13 1157 02/18/13 1706  GLUCAP 120* 169* 118* 133* 131*    Recent Results (from the past 240 hour(s))  CULTURE, BLOOD (SINGLE)     Status: None   Collection Time    02/13/13 10:30 PM      Result Value Range Status   Specimen Description BLOOD LEFT HAND   Final   Special Requests BOTTLES DRAWN AEROBIC AND ANAEROBIC 10CC   Final   Culture  Setup Time     Final   Value: 02/14/2013 05:54     Performed at [facility]   Culture     Final   Value: STAPHYLOCOCCUS AUREUS     Note: RIFAMPIN AND GENTAMICIN SHOULD NOT BE USED AS SINGLE DRUGS  FOR TREATMENT OF STAPH INFECTIONS. This organism is presumed to be Clindamycin resistant based on detection of inducible Clindamycin resistance.     Note: Gram Stain Report Called to,Read Back By and Verified With: KAYLIN REID ON 02/14/2013 AT 11:48P BY Serafina Mitchell     Performed at [facility]   Report Status 02/16/2013 FINAL   Final   Organism ID, Bacteria STAPHYLOCOCCUS AUREUS   Final  URINE CULTURE     Status: None   Collection Time    02/13/13 10:46 PM      Result Value Range Status   Specimen Description URINE, CLEAN CATCH   Final   Special Requests Normal   Final   Culture  Setup Time     Final   Value: 02/14/2013 01:11     Performed at [facility] Count     Final   Value: NO GROWTH     Performed at [facility]   Culture     Final   Value: NO GROWTH     Performed at [facility]   Report Status 02/15/2013 FINAL   Final  MRSA PCR SCREENING     Status: None   Collection Time    02/14/13  2:21 AM      Result Value Range Status   MRSA by PCR NEGATIVE  NEGATIVE Final   Comment:            The GeneXpert MRSA Assay (FDA     approved for NASAL specimens     only), is one component of a     comprehensive MRSA colonization     surveillance program. It is not     intended to diagnose MRSA     infection nor to guide or     monitor treatment for     MRSA infections.  CULTURE, BLOOD (ROUTINE X 2)     Status: None   Collection Time    02/15/13  3:23 PM      Result Value Range Status   Specimen Description BLOOD RIGHT FOREARM   Final   Special Requests BOTTLES DRAWN AEROBIC ONLY 10CC   Final   Culture  Setup Time     Final   Value: 02/16/2013 01:44     Performed at [facility]   Culture     Final   Value:        BLOOD CULTURE RECEIVED NO GROWTH TO DATE CULTURE WILL BE HELD FOR 5 DAYS BEFORE ISSUING A FINAL NEGATIVE REPORT     Performed at [facility]   Report Status PENDING   Incomplete  CULTURE, BLOOD (ROUTINE X 2)      Status: None   Collection Time  02/15/13  3:30 PM      Result Value Range Status   Specimen Description BLOOD RIGHT HAND   Final   Special Requests BOTTLES DRAWN AEROBIC ONLY 3CC   Final   Culture  Setup Time     Final   Value: 02/16/2013 01:44     Performed at [facility]   Culture     Final   Value:        BLOOD CULTURE RECEIVED NO GROWTH TO DATE CULTURE WILL BE HELD FOR 5 DAYS BEFORE ISSUING A FINAL NEGATIVE REPORT     Performed at [facility]   Report Status PENDING   Incomplete     Studies: No results found.  Scheduled Meds: . atorvastatin  10 mg Oral QPM  .  ceFAZolin (ANCEF) IV  2 g Intravenous Q8H  . docusate sodium  100 mg Oral BID  . ibuprofen  600 mg Oral Q6H  . insulin aspart  0-15 Units Subcutaneous TID WC  . polyethylene glycol  17 g Oral Daily  . sodium chloride  3 mL Intravenous Q12H   Continuous Infusions: . sodium chloride 75 mL/hr at 02/18/13 0052  . heparin 2,550 Units/hr (02/18/13 1210)    Principal Problem:   Sepsis associated hypotension Active Problems:   Diabetes mellitus   Hypotension (arterial)   Staphylococcus aureus sepsis   Thrombocytopenia, unspecified   Septic thrombophlebitis    Time spent: 35 minutes    Jeralyn Bennett  [facility] Pager [phone]. If 7PM-7AM, please contact night-coverage at [email or web address], password [facility] 02/18/2013, 6:31 PM  LOS: 5 days

## 2013-02-19 ENCOUNTER — Telehealth: Payer: Self-pay | Admitting: Vascular Surgery

## 2013-02-19 DIAGNOSIS — A419 Sepsis, unspecified organism: Secondary | ICD-10-CM

## 2013-02-19 DIAGNOSIS — I803 Phlebitis and thrombophlebitis of lower extremities, unspecified: Secondary | ICD-10-CM

## 2013-02-19 DIAGNOSIS — R7881 Bacteremia: Secondary | ICD-10-CM

## 2013-02-19 DIAGNOSIS — A4901 Methicillin susceptible Staphylococcus aureus infection, unspecified site: Secondary | ICD-10-CM

## 2013-02-19 LAB — CBC
Hemoglobin: 11.7 g/dL — ABNORMAL LOW (ref 12.0–15.0)
MCH: 30.2 pg (ref 26.0–34.0)
MCV: 88.4 fL (ref 78.0–100.0)
RBC: 3.88 MIL/uL (ref 3.87–5.11)
RDW: 13.8 % (ref 11.5–15.5)
WBC: 10.6 10*3/uL — ABNORMAL HIGH (ref 4.0–10.5)

## 2013-02-19 LAB — GLUCOSE, CAPILLARY
Glucose-Capillary: 122 mg/dL — ABNORMAL HIGH (ref 70–99)
Glucose-Capillary: 102 mg/dL — ABNORMAL HIGH (ref 70–99)
Glucose-Capillary: 124 mg/dL — ABNORMAL HIGH (ref 70–99)

## 2013-02-19 LAB — BASIC METABOLIC PANEL
BUN: 6 mg/dL (ref 6–23)
CO2: 30 mEq/L (ref 19–32)
Chloride: 101 mEq/L (ref 96–112)
GFR calc Af Amer: >90 mL/min (ref >90)
GFR calc non Af Amer: >90 mL/min (ref >90)
Potassium: 2.5 mEq/L — CL (ref 3.5–5.1)
Sodium: 139 mEq/L (ref 135–145)

## 2013-02-19 LAB — HEPARIN LEVEL (UNFRACTIONATED): Heparin Unfractionated: 0.64 IU/mL (ref 0.30–0.70)

## 2013-02-19 MED ORDER — SODIUM CHLORIDE 0.9 % IJ SOLN
10.0000 mL | INTRAMUSCULAR | Status: DC | PRN
  Administered 2013-02-20 – 2013-02-21 (×2): 10 mL

## 2013-02-19 MED ORDER — POTASSIUM CHLORIDE 10 MEQ/100ML IV SOLN
10.0000 meq | INTRAVENOUS | Status: AC
  Administered 2013-02-19 (×3): 10 meq via INTRAVENOUS

## 2013-02-19 MED ORDER — POTASSIUM CHLORIDE CRYS ER 20 MEQ PO TBCR
40.0000 meq | EXTENDED_RELEASE_TABLET | Freq: Once | ORAL | Status: AC
  Administered 2013-02-19: 40 meq via ORAL
  Filled 2013-02-19: qty 2

## 2013-02-19 MED ORDER — POTASSIUM CHLORIDE 10 MEQ/100ML IV SOLN
10.0000 meq | INTRAVENOUS | Status: AC
  Administered 2013-02-19: 10 meq via INTRAVENOUS
  Filled 2013-02-19: qty 100

## 2013-02-19 NOTE — Progress Notes (Signed)
VASCULAR PROGRESS NOTE  SUBJECTIVE: right side feels better.  PHYSICAL EXAM: Filed Vitals:   02/18/13 0634 02/18/13 1400 02/18/13 2103 02/19/13 0520  BP: 155/72 153/65 126/53 133/64  Pulse: 80 76 72 72  Temp: 98.5 F (36.9 C) 98.9 F (37.2 C) 98.6 F (37 C) 98.8 F (37.1 C)  TempSrc: Oral Oral Oral Oral  Resp: 16 18 16 16   Height:      Weight:      SpO2: 96% 95% 94% 94%   No further bloody drainage from right thigh wound.  LABS: Lab Results  Component Value Date   WBC 10.6* 02/19/2013   HGB 11.7* 02/19/2013   HCT 34.3* 02/19/2013   MCV 88.4 02/19/2013   PLT 220 02/19/2013   Lab Results  Component Value Date   CREATININE 0.48* 02/19/2013    Recent Labs  02/18/13 1706 02/18/13 2106 02/19/13 0805  GLUCAP 131* 138* 122*   Principal Problem:   Sepsis associated hypotension Active Problems:   Diabetes mellitus   Hypotension (arterial)   Staphylococcus aureus sepsis   Thrombocytopenia, unspecified   Septic thrombophlebitis  ASSESSMENT AND PLAN:  * I have instructed the family on dressing the wound on her right thigh if it drains. If she is D/C'd, I can follow this as an out-patient.   Cari Caraway Beeper: [phone] 02/19/2013

## 2013-02-19 NOTE — Progress Notes (Signed)
[facility]  Date of Admission:  02/13/2013  Antibiotics: Antibiotics Given (last 72 hours)   Date/Time Action Medication Dose Rate   02/16/13 1146 Given   ceFAZolin (ANCEF) IVPB 2 g/50 mL premix 2 g 100 mL/hr   02/16/13 1941 Given   ceFAZolin (ANCEF) IVPB 2 g/50 mL premix 2 g 100 mL/hr   02/17/13 0454 Given   ceFAZolin (ANCEF) IVPB 2 g/50 mL premix 2 g 100 mL/hr   02/17/13 1202 Given   ceFAZolin (ANCEF) IVPB 2 g/50 mL premix 2 g 100 mL/hr   02/17/13 2038 Given   ceFAZolin (ANCEF) IVPB 2 g/50 mL premix 2 g 100 mL/hr   02/18/13 0445 Given   ceFAZolin (ANCEF) IVPB 2 g/50 mL premix 2 g 100 mL/hr   02/18/13 1209 Given   ceFAZolin (ANCEF) IVPB 2 g/50 mL premix 2 g 100 mL/hr   02/18/13 2034 Given   ceFAZolin (ANCEF) IVPB 2 g/50 mL premix 2 g 100 mL/hr   02/19/13 0530 Given   ceFAZolin (ANCEF) IVPB 2 g/50 mL premix 2 g 100 mL/hr      Subjective: No complaints  Objective: Temp:  [98.6 F (37 C)-98.9 F (37.2 C)] 98.8 F (37.1 C) (10/27 0520) Pulse Rate:  [72-76] 72 (10/27 0520) Resp:  [16-18] 16 (10/27 0520) BP: (126-153)/(53-65) 133/64 mmHg (10/27 0520) SpO2:  [94 %-95 %] 94 % (10/27 0520)  General: Awake, alert nad, up walking with PT Skin: no rashes Lungs: CTA B Cor: RRR Abdomen: soft, nt, nd Ext: no edema  Lab Results Lab Results  Component Value Date   WBC 10.6* 02/19/2013   HGB 11.7* 02/19/2013   HCT 34.3* 02/19/2013   MCV 88.4 02/19/2013   PLT 220 02/19/2013    Lab Results  Component Value Date   CREATININE 0.48* 02/19/2013   BUN 6 02/19/2013   NA 139 02/19/2013   K 2.5* 02/19/2013   CL 101 02/19/2013   CO2 30 02/19/2013    Lab Results  Component Value Date   ALT 20 02/13/2013   AST 30 02/13/2013   ALKPHOS 110 02/13/2013   BILITOT 0.6 02/13/2013      Microbiology: Recent Results (from the past 240 hour(s))  CULTURE, BLOOD (SINGLE)     Status: None   Collection Time    02/13/13 10:30 PM      Result Value Range  Status   Specimen Description BLOOD LEFT HAND   Final   Special Requests BOTTLES DRAWN AEROBIC AND ANAEROBIC 10CC   Final   Culture  Setup Time     Final   Value: 02/14/2013 05:54     Performed at [facility]   Culture     Final   Value: STAPHYLOCOCCUS AUREUS     Note: RIFAMPIN AND GENTAMICIN SHOULD NOT BE USED AS SINGLE DRUGS FOR TREATMENT OF STAPH INFECTIONS. This organism is presumed to be Clindamycin resistant based on detection of inducible Clindamycin resistance.     Note: Gram Stain Report Called to,Read Back By and Verified With: KAYLIN REID ON 02/14/2013 AT 11:48P BY Serafina Mitchell     Performed at [facility]   Report Status 02/16/2013 FINAL   Final   Organism ID, Bacteria STAPHYLOCOCCUS AUREUS   Final  URINE CULTURE     Status: None   Collection Time    02/13/13 10:46 PM      Result Value Range Status   Specimen Description URINE, CLEAN CATCH   Final   Special Requests  Normal   Final   Culture  Setup Time     Final   Value: 02/14/2013 01:11     Performed at [facility] Count     Final   Value: NO GROWTH     Performed at [facility]   Culture     Final   Value: NO GROWTH     Performed at [facility]   Report Status 02/15/2013 FINAL   Final  MRSA PCR SCREENING     Status: None   Collection Time    02/14/13  2:21 AM      Result Value Range Status   MRSA by PCR NEGATIVE  NEGATIVE Final   Comment:            The GeneXpert MRSA Assay (FDA     approved for NASAL specimens     only), is one component of a     comprehensive MRSA colonization     surveillance program. It is not     intended to diagnose MRSA     infection nor to guide or     monitor treatment for     MRSA infections.  CULTURE, BLOOD (ROUTINE X 2)     Status: None   Collection Time    02/15/13  3:23 PM      Result Value Range Status   Specimen Description BLOOD RIGHT FOREARM   Final   Special Requests BOTTLES DRAWN AEROBIC ONLY 10CC   Final   Culture  Setup  Time     Final   Value: 02/16/2013 01:44     Performed at [facility]   Culture     Final   Value:        BLOOD CULTURE RECEIVED NO GROWTH TO DATE CULTURE WILL BE HELD FOR 5 DAYS BEFORE ISSUING A FINAL NEGATIVE REPORT     Performed at [facility]   Report Status PENDING   Incomplete  CULTURE, BLOOD (ROUTINE X 2)     Status: None   Collection Time    02/15/13  3:30 PM      Result Value Range Status   Specimen Description BLOOD RIGHT HAND   Final   Special Requests BOTTLES DRAWN AEROBIC ONLY 3CC   Final   Culture  Setup Time     Final   Value: 02/16/2013 01:44     Performed at [facility]   Culture     Final   Value:        BLOOD CULTURE RECEIVED NO GROWTH TO DATE CULTURE WILL BE HELD FOR 5 DAYS BEFORE ISSUING A FINAL NEGATIVE REPORT     Performed at [facility]   Report Status PENDING   Incomplete    Studies/Results: No results found.  Assessment/Plan: 1) Staph aureus bacteremia with superficial thrombophlebitis of right thigh - MSSA and now on cefazolin.  Echo noted and no abnormalities, no vegetation.  Repeat blood cultures negative.  -I recommend treatment for 4 weeks (with thrombophlebitis) with cefazolin through November 19th -pic ok for today -weekly cbc, cmp and antibiotics per home health protocol to [facility] We will arrange follow up in our office in about 10-14 days    Staci Righter, MD [facility] [email or web address] [phone] pager   [phone] cell 02/19/2013, 11:36 AM

## 2013-02-19 NOTE — Telephone Encounter (Addendum)
Message copied by Fredrich Birks on Mon Feb 19, 2013 10:10 AM ------      Message from: Melene Plan      Created: Mon Feb 19, 2013  9:50 AM      Regarding: FW: charge and f/u                   ----- Message -----         From: Chuck Hint, MD         Sent: 02/17/2013   7:38 AM           To: Reuel Derby, Melene Plan, RN, #      Subject: charge and f/u                                           Level 1 f/u visit.      She will need an outpt visit in 2 months.       Thanks      CD ------  Lm for pt at home # and mailed letter, dpm

## 2013-02-19 NOTE — Progress Notes (Signed)
[facility] PROGRESS NOTE  Marie Stone AVW:[id number] DOB: April 24, 1951 DOA: 02/13/2013 PCP: Dario Guardian  Assessment/Plan: 1. 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 Blood cultures followed.  2. Hypokalemia. Patient's potassium dropping to 2.5. I have ordered 40 mEq of IV potassium chloride to be administered over 4 hours. Will followup on repeat potassium level. 3. Diabetes mellitus. Blood sugar stable, continue Accu-Cheks q. a.c. each bedtime with sliding scale coverage 4. Thrombophlebitis of right thigh. Patient on heparin 5. Dyslipidemia. Continue statin therapy  Code Status: Full code Family Communication: Plan discussed with patient Disposition Plan: Plan for PICC line placement today.    Consultants: Infectious disease Vascular surgery  Procedures:  Transthoracic echocardiogram performed on 02/12/2013 impression: Left ventricular ejection fraction estimated at 50-55%, without wall motion abnormalities, vegetation is absent  Antibiotics:  Ancef  HPI/Subjective: Patient overall seems improved, she is looking for to be discharged soon. Currently awaiting PICC line placement as well as infusion of potassium chloride. Case manager consulted for establishing home health services.   Objective: Filed Vitals:   02/19/13 1335  BP: 155/72  Pulse: 71  Temp: 98.4 F (36.9 C)  Resp: 20   No intake or output data in the 24 hours ending 02/19/13 1415 Filed Weights   02/13/13 2145 02/14/13 0213 02/14/13 0455  Weight: 95.255 kg (210 lb) 94.9 kg (209 lb 3.5 oz) 94.9 kg (209 lb 3.5 oz)    Exam:   General:  Patient is in no acute distress she is awake alert  Cardiovascular: Regular rate and rhythm normal S1 is  Respiratory: Lungs are clear to auscultation bilaterally  Abdomen: Soft nontender nondistended  positive bowel  Musculoskeletal: Patient has erythema and some swelling o, region is painful to palpationver her right thigh   Data Reviewed: Basic Metabolic Panel:  Recent Labs Lab 02/13/13 1951 02/14/13 0521 02/15/13 0350 02/19/13 0452  NA 133* 136 133* 139  K 3.3* 2.8* 3.0* 2.5*  CL 98 105 100 101  CO2 21 20 19 30   GLUCOSE 241* 200* 123* 117*  BUN 6 8 8 6   CREATININE 0.61 0.59 0.56 0.48*  CALCIUM 8.8 7.6* 7.5* 7.9*  MG  --   --  1.6  --    Liver Function Tests:  Recent Labs Lab 02/13/13 1951  AST 30  ALT 20  ALKPHOS 110  BILITOT 0.6  PROT 7.2  ALBUMIN 3.4*   No results found for this basename: LIPASE, AMYLASE,  in the last 168 hours No results found for this basename: AMMONIA,  in the last 168 hours CBC:  Recent Labs Lab 02/13/13 1951  02/15/13 0550 02/16/13 0600 02/17/13 0419 02/18/13 0900 02/19/13 0452  WBC 16.1*  < > 11.1* 8.5 8.3 9.6 10.6*  NEUTROABS 15.3*  --   --   --   --   --   --   HGB 15.5*  < > 13.8 12.7 12.3 12.4 11.7*  HCT 43.1  < > 40.8 36.4 34.9* 35.1* 34.3*  MCV 88.9  < > 89.9 88.1 87.9 87.1 88.4  PLT 179  < > 123* 122* 141* 191 220  < > = values in this interval not displayed. Cardiac Enzymes: No results found for this basename: CKTOTAL, CKMB, CKMBINDEX, TROPONINI,  in the last 168 hours BNP (last 3 results) No results found for this basename: PROBNP,  in the last 8760 hours CBG:  Recent Labs Lab  02/18/13 1157 02/18/13 1706 02/18/13 2106 02/19/13 0805 02/19/13 1224  GLUCAP 133* 131* 138* 122* 102*    Recent Results (from the past 240 hour(s))  CULTURE, BLOOD (SINGLE)     Status: None   Collection Time    02/13/13 10:30 PM      Result Value Range Status   Specimen Description BLOOD LEFT HAND   Final   Special Requests BOTTLES DRAWN AEROBIC AND ANAEROBIC 10CC   Final   Culture  Setup Time     Final   Value: 02/14/2013 05:54     Performed at [facility]   Culture     Final   Value: STAPHYLOCOCCUS AUREUS      Note: RIFAMPIN AND GENTAMICIN SHOULD NOT BE USED AS SINGLE DRUGS FOR TREATMENT OF STAPH INFECTIONS. This organism is presumed to be Clindamycin resistant based on detection of inducible Clindamycin resistance.     Note: Gram Stain Report Called to,Read Back By and Verified With: KAYLIN REID ON 02/14/2013 AT 11:48P BY Serafina Mitchell     Performed at [facility]   Report Status 02/16/2013 FINAL   Final   Organism ID, Bacteria STAPHYLOCOCCUS AUREUS   Final  URINE CULTURE     Status: None   Collection Time    02/13/13 10:46 PM      Result Value Range Status   Specimen Description URINE, CLEAN CATCH   Final   Special Requests Normal   Final   Culture  Setup Time     Final   Value: 02/14/2013 01:11     Performed at [facility] Count     Final   Value: NO GROWTH     Performed at [facility]   Culture     Final   Value: NO GROWTH     Performed at [facility]   Report Status 02/15/2013 FINAL   Final  MRSA PCR SCREENING     Status: None   Collection Time    02/14/13  2:21 AM      Result Value Range Status   MRSA by PCR NEGATIVE  NEGATIVE Final   Comment:            The GeneXpert MRSA Assay (FDA     approved for NASAL specimens     only), is one component of a     comprehensive MRSA colonization     surveillance program. It is not     intended to diagnose MRSA     infection nor to guide or     monitor treatment for     MRSA infections.  CULTURE, BLOOD (ROUTINE X 2)     Status: None   Collection Time    02/15/13  3:23 PM      Result Value Range Status   Specimen Description BLOOD RIGHT FOREARM   Final   Special Requests BOTTLES DRAWN AEROBIC ONLY 10CC   Final   Culture  Setup Time     Final   Value: 02/16/2013 01:44     Performed at [facility]   Culture     Final   Value:        BLOOD CULTURE RECEIVED NO GROWTH TO DATE CULTURE WILL BE HELD FOR 5 DAYS BEFORE ISSUING A FINAL NEGATIVE REPORT     Performed at [facility]   Report  Status PENDING   Incomplete  CULTURE, BLOOD (ROUTINE X 2)     Status: None  Collection Time    02/15/13  3:30 PM      Result Value Range Status   Specimen Description BLOOD RIGHT HAND   Final   Special Requests BOTTLES DRAWN AEROBIC ONLY 3CC   Final   Culture  Setup Time     Final   Value: 02/16/2013 01:44     Performed at [facility]   Culture     Final   Value:        BLOOD CULTURE RECEIVED NO GROWTH TO DATE CULTURE WILL BE HELD FOR 5 DAYS BEFORE ISSUING A FINAL NEGATIVE REPORT     Performed at [facility]   Report Status PENDING   Incomplete     Studies: No results found.  Scheduled Meds: . atorvastatin  10 mg Oral QPM  .  ceFAZolin (ANCEF) IV  2 g Intravenous Q8H  . docusate sodium  100 mg Oral BID  . ibuprofen  600 mg Oral Q6H  . polyethylene glycol  17 g Oral Daily  . potassium chloride  10 mEq Intravenous Q1 Hr x 3  . sodium chloride  3 mL Intravenous Q12H   Continuous Infusions: . sodium chloride 75 mL/hr at 02/19/13 0741  . heparin 2,550 Units/hr (02/19/13 0741)    Principal Problem:   Sepsis associated hypotension Active Problems:   Diabetes mellitus   Hypotension (arterial)   Staphylococcus aureus sepsis   Thrombocytopenia, unspecified   Septic thrombophlebitis    Time spent: 35 minutes    Jeralyn Bennett  [facility] Pager [phone]. If 7PM-7AM, please contact night-coverage at [email or web address], password [facility] 02/19/2013, 2:15 PM  LOS: 6 days

## 2013-02-19 NOTE — Progress Notes (Signed)
ANTICOAGULATION AND ANTIBIOTIC CONSULT NOTE - Follow Up Consult  Pharmacy Consult for Heparin and Ancef Indication: thrombophlebitis and MSSA bacteremia with superficial thrombophlebitis of R thigh No Known Allergies  Patient Measurements: Height: 5\' 5"  (165.1 cm) Weight: 209 lb 3.5 oz (94.9 kg) IBW/kg (Calculated) : 57 Heparin Dosing Weight: 80 kg  Vital Signs: Temp: 98.8 F (37.1 C) (10/27 0520) Temp src: Oral (10/27 0520) BP: 133/64 mmHg (10/27 0520) Pulse Rate: 72 (10/27 0520)  Labs:  Recent Labs  02/17/13 0419  02/18/13 0900 02/18/13 1810 02/19/13 0452  HGB 12.3  --  12.4  --  11.7*  HCT 34.9*  --  35.1*  --  34.3*  PLT 141*  --  191  --  220  HEPARINUNFRC 0.13*  < > 0.71* 0.66 0.64  CREATININE  --   --   --   --  0.48*  < > = values in this interval not displayed.  Estimated Creatinine Clearance: 84.2 ml/min (by C-G formula based on Cr of 0.48).   Medications:  Infusions:  . sodium chloride 75 mL/hr at 02/19/13 0741  . heparin 2,550 Units/hr (02/19/13 0741)    Assessment: 62 year old female receiving anticoagulation with Heparin for thrombophlebitis.  Heparin level 0.64 on heparin drip at 2550 units/hr.  CBC stable, no bleeding reported. ID: wbc 10.6, afebrile, creat 0.48. Ancef per Rx for MSSA bacteremia with superficial thrombophlebitis of R thigh. Afeb. Wbc nl. Echo w/ no vegetation.  Repeat BC negative.  ID rec Ancef x 4 wks (through 11/19) via PICC line.  Goal of Therapy:  Heparin level 0.3-0.7 units/ml Monitor platelets by anticoagulation protocol: Yes   Plan: 1. continue Heparin drip at 2550 units/hr 2. Continue ancef 2 gm IV q8h for 4 week course via PICC, to end 03/14/13.  Weekly cbc, cmp per home health. 3. Daily HL and CBC Herby Abraham, Pharm.D. [phone] 02/19/2013 1:13 PM

## 2013-02-19 NOTE — Progress Notes (Signed)
Seen and agreed with above note 02/19/2013 Fredrich Birks PTA [phone] pager [phone] office

## 2013-02-19 NOTE — Progress Notes (Signed)
Peripherally Inserted Central Catheter/Midline Placement  The IV Nurse has discussed with the patient and/or persons authorized to consent for the patient, the purpose of this procedure and the potential benefits and risks involved with this procedure.  The benefits include less needle sticks, lab draws from the catheter and patient may be discharged home with the catheter.  Risks include, but not limited to, infection, bleeding, blood clot (thrombus formation), and puncture of an artery; nerve damage and irregular heat beat.  Alternatives to this procedure were also discussed.  PICC/Midline Placement Documentation        Marie Stone 02/19/2013, 6:30 PM

## 2013-02-19 NOTE — Care Management Note (Signed)
    Page 1 of 1   02/19/2013     10:17:14 AM   CARE MANAGEMENT NOTE 02/19/2013  Patient:  ARTRICE, KRAKER   Account Number:  [id number]  Date Initiated:  02/14/2013  Documentation initiated by:  MAYO,HENRIETTA  Subjective/Objective Assessment:   adm with dx of sepsis; lives with spouse    PCP Malen Gauze PA  [facility]     Action/Plan:   Anticipated DC Date:  02/19/2013   Anticipated DC Plan:  HOME W HOME HEALTH SERVICES      DC Planning Services  CM consult      Choice offered to / List presented to:  C-1 Patient        HH arranged  HH-1 RN      [facility] agency  [facility]   Status of service:   Medicare Important Message given?   (If response is "NO", the following Medicare IM given date fields will be blank) Date Medicare IM given:   Date Additional Medicare IM given:    Discharge Disposition:    Per UR Regulation:  Reviewed for med. necessity/level of care/duration of stay  If discussed at Long Length of Stay Meetings, dates discussed:    Comments:

## 2013-02-19 NOTE — Progress Notes (Signed)
Physical Therapy Treatment Patient Details Name: Marie Stone MRN: [id number] DOB: December 23, 1950 Today's Date: 02/19/2013 Time: 4098-1191 PT Time Calculation (min): 24 min  PT Assessment / Plan / Recommendation  History of Present Illness     PT Comments   Pt did well with tx today; pt performed functional mobility within room well today.  Able to complete stair training; educated pt and family of importance of someone being with her doing stairs.   Pt planning on d/c today per MD note.  Follow Up Recommendations  No PT follow up;Supervision/Assistance - 24 hour     Does the patient have the potential to tolerate intense rehabilitation     Barriers to Discharge        Equipment Recommendations  None recommended by PT    Recommendations for Other Services    Frequency Min 3X/week   Progress towards PT Goals Progress towards PT goals: Progressing toward goals  Plan      Precautions / Restrictions Precautions Precautions: Fall Restrictions Weight Bearing Restrictions: No   Pertinent Vitals/Pain 3/10 R LE pain; repositioned in bed for comfort post tx    Mobility  Bed Mobility Bed Mobility: Rolling Left;Left Sidelying to Sit Rolling Left: 6: Modified independent (Device/Increase time) Left Sidelying to Sit: 6: Modified independent (Device/Increase time) Transfers Transfers: Sit to Stand;Stand to Sit Sit to Stand: From bed;From toilet;With upper extremity assist;6: Modified independent (Device/Increase time) Stand to Sit: 6: Modified independent (Device/Increase time);With upper extremity assist;To toilet;To bed Ambulation/Gait Ambulation/Gait Assistance: 6: Modified independent (Device/Increase time) Ambulation Distance (Feet): 150 Feet Assistive device: Other (Comment) (IV Pole) Ambulation/Gait Assistance Details: Pt had decreased stride length and reached for wall and counter for balance. Gait Pattern: Step-through pattern;Decreased stride length;Wide base of  support Gait velocity: decreased Stairs: Yes Stairs Assistance: 4: Min guard Stair Management Technique: One rail Left Number of Stairs: 4 Wheelchair Mobility Wheelchair Mobility: No    Exercises     PT Diagnosis:    PT Problem List:   PT Treatment Interventions:     PT Goals (current goals can now be found in the care plan section)    Visit Information  Last PT Received On: 02/19/13 Assistance Needed: +1    Subjective Data      Cognition  Cognition Arousal/Alertness: Awake/alert Behavior During Therapy: WFL for tasks assessed/performed Overall Cognitive Status: Within Functional Limits for tasks assessed    Balance     End of Session PT - End of Session Equipment Utilized During Treatment: Gait belt Activity Tolerance: Patient tolerated treatment well Patient left: in bed;with call bell/phone within reach;with family/visitor present Nurse Communication: Mobility status   GP     Marie Stone, Marie Stone 02/19/2013, 12:02 PM

## 2013-02-20 DIAGNOSIS — E876 Hypokalemia: Secondary | ICD-10-CM

## 2013-02-20 LAB — BASIC METABOLIC PANEL
CO2: 30 mEq/L (ref 19–32)
Calcium: 8.3 mg/dL — ABNORMAL LOW (ref 8.4–10.5)
Chloride: 100 mEq/L (ref 96–112)
Creatinine, Ser: 0.49 mg/dL — ABNORMAL LOW (ref 0.50–1.10)
Glucose, Bld: 111 mg/dL — ABNORMAL HIGH (ref 70–99)
Sodium: 137 mEq/L (ref 135–145)

## 2013-02-20 LAB — CBC
Hemoglobin: 11.6 g/dL — ABNORMAL LOW (ref 12.0–15.0)
MCH: 30.9 pg (ref 26.0–34.0)
MCHC: 34.7 g/dL (ref 30.0–36.0)
MCV: 89.1 fL (ref 78.0–100.0)
RBC: 3.75 MIL/uL — ABNORMAL LOW (ref 3.87–5.11)
WBC: 8.2 10*3/uL (ref 4.0–10.5)

## 2013-02-20 LAB — GLUCOSE, CAPILLARY
Glucose-Capillary: 130 mg/dL — ABNORMAL HIGH (ref 70–99)
Glucose-Capillary: 133 mg/dL — ABNORMAL HIGH (ref 70–99)

## 2013-02-20 MED ORDER — POTASSIUM CHLORIDE CRYS ER 20 MEQ PO TBCR
40.0000 meq | EXTENDED_RELEASE_TABLET | ORAL | Status: AC
  Administered 2013-02-20 (×3): 40 meq via ORAL
  Filled 2013-02-20 (×3): qty 2

## 2013-02-20 MED ORDER — POTASSIUM CHLORIDE IN NACL 20-0.9 MEQ/L-% IV SOLN
INTRAVENOUS | Status: DC
  Administered 2013-02-20: 15:00:00 via INTRAVENOUS
  Filled 2013-02-20 (×3): qty 1000

## 2013-02-20 MED ORDER — OXYCODONE HCL 5 MG PO TABS
5.0000 mg | ORAL_TABLET | Freq: Four times a day (QID) | ORAL | Status: DC | PRN
  Administered 2013-02-20 – 2013-02-21 (×2): 5 mg via ORAL
  Filled 2013-02-20 (×2): qty 1

## 2013-02-20 NOTE — Progress Notes (Signed)
[facility] PROGRESS NOTE  Marie Stone WUJ:[id number] DOB: Apr 05, 1951 DOA: 02/13/2013 PCP: Dario Guardian  Interim summary Patient is a pleasant 62 year old female who was admitted on 02/14/2013, presented with complaints of fevers, having a temperature of 103, chills, hypotension, and mental status changes. She recently underwent laser ablation of her right greater saphenous vein and segmental excision of varicose veins of right thigh on 01/31/2013 . In the interim she had a follow up visit with complaints of a bleeding which was addressed with additional laser ablation and injection therapy . She continued to have severe thigh pain throughout this period. Patient was seen by Dr. Durwin Nora no vascular surgery on admission, felt patient had thrombophlebitis of right thigh, and recommended IV heparin as well as continuing IV antibiotic therapy with vancomycin and Unasyn. There was concerns with further surgical exploration given her significant risk of wound healing problems with history of obesity and diabetes mellitus.  Infectious disease was consulted as well. Blood cultures drawn on 02/13/2013 grew Staphylococcus aureus, organism sensitive to oxacillin, vancomycin, bactrim tetracycline, rifampin, fluoroquinolones. Her antibiotic regimen was changed to Ancef. she had transthoracic echocardiogram that was performed on 02/16/2013 with a study not showing evidence of vegetation. On 02/16/2013 she was transferred out of the step down unit to MedSurg. Her hospitalization has been complicated by the development of hypokalemia as a.m. lab work again showed a potassium of 2.6. I am planning on administering potassium chloride 40 mEq by mouth x3 doses, with repeat potassium level this evening. The plan otherwise would be for patient to receive one month course of Ancef. Social work was consulted to set up home health services. Home health services should be by today to give instruction to her husband on IV  antibiotic administration at home. Anticipate discharge in the next 24-48 hours. PICC line was placed on 02/19/2013.                                                                                                                                                  Assessment/Plan: 1. 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 Repeat blood cultures remaining negative.  2. Hypokalemia. A.m. lab work again showed a potassium of 2.6, we'll plan to administer K. Dur 40 mEq every 4 hours x3 doses, repeat potassium level in the evening. 3. Diabetes mellitus. Blood sugar stable, continue Accu-Cheks q. a.c. each bedtime with sliding scale coverage 4. Thrombophlebitis of right thigh. IV heparin has been discontinued.  5. Dyslipidemia. Continue statin therapy  Code Status: Full code Family Communication: Plan discussed with husband who was present at bedside Disposition Plan: Replace her potassium today, hopefully can: Home in the next 24-48 hours.    Consultants: Infectious disease Vascular surgery  Procedures:  Transthoracic echocardiogram performed  on 02/12/2013 impression: Left ventricular ejection fraction estimated at 50-55%, without wall motion abnormalities, vegetation is absent  Antibiotics:  Ancef  HPI/Subjective: Patient states doing well, was looking for to be discharged today. Unfortunately potassium levels remained low at 2.6. She will stay over the day to receive her potassium replacement. She denies fevers chills nausea vomiting, tolerating by mouth intake.  Objective: Filed Vitals:   02/20/13 0511  BP: 160/85  Pulse: 85  Temp: 98.7 F (37.1 C)  Resp: 18    Intake/Output Summary (Last 24 hours) at 02/20/13 1220 Last data filed at 02/20/13 0555  Gross per 24 hour  Intake 1624.75 ml  Output      0 ml  Net 1624.75 ml    Filed Weights   02/13/13 2145 02/14/13 0213 02/14/13 0455  Weight: 95.255 kg (210 lb) 94.9 kg (209 lb 3.5 oz) 94.9 kg (209 lb 3.5 oz)    Exam:   General:  Patient is in no acute distress she is awake alert  Cardiovascular: Regular rate and rhythm normal S1 is  Respiratory: Lungs are clear to auscultation bilaterally  Abdomen: Soft nontender nondistended positive bowel  Musculoskeletal: Patient has erythema and some swelling o, region is painful to palpationver her right thigh   Data Reviewed: Basic Metabolic Panel:  Recent Labs Lab 02/13/13 1951 02/14/13 0521 02/15/13 0350 02/19/13 0452 02/19/13 1359 02/20/13 0540  NA 133* 136 133* 139  --  137  K 3.3* 2.8* 3.0* 2.5* 2.6* 2.6*  CL 98 105 100 101  --  100  CO2 21 20 19 30   --  30  GLUCOSE 241* 200* 123* 117*  --  111*  BUN 6 8 8 6   --  5*  CREATININE 0.61 0.59 0.56 0.48*  --  0.49*  CALCIUM 8.8 7.6* 7.5* 7.9*  --  8.3*  MG  --   --  1.6  --   --   --    Liver Function Tests:  Recent Labs Lab 02/13/13 1951  AST 30  ALT 20  ALKPHOS 110  BILITOT 0.6  PROT 7.2  ALBUMIN 3.4*   No results found for this basename: LIPASE, AMYLASE,  in the last 168 hours No results found for this basename: AMMONIA,  in the last 168 hours CBC:  Recent Labs Lab 02/13/13 1951  02/16/13 0600 02/17/13 0419 02/18/13 0900 02/19/13 0452 02/20/13 0540  WBC 16.1*  < > 8.5 8.3 9.6 10.6* 8.2  NEUTROABS 15.3*  --   --   --   --   --   --   HGB 15.5*  < > 12.7 12.3 12.4 11.7* 11.6*  HCT 43.1  < > 36.4 34.9* 35.1* 34.3* 33.4*  MCV 88.9  < > 88.1 87.9 87.1 88.4 89.1  PLT 179  < > 122* 141* 191 220 244  < > = values in this interval not displayed. Cardiac Enzymes: No results found for this basename: CKTOTAL, CKMB, CKMBINDEX, TROPONINI,  in the last 168 hours BNP (last 3 results) No results found for this basename: PROBNP,  in the last 8760 hours CBG:  Recent Labs Lab 02/19/13 0805 02/19/13 1224 02/19/13 1707 02/19/13 2136  02/20/13 0757  GLUCAP 122* 102* 124* 130* 138*    Recent Results (from the past 240 hour(s))  CULTURE, BLOOD (SINGLE)     Status: None   Collection Time    02/13/13 10:30 PM      Result Value Range Status   Specimen Description BLOOD LEFT HAND  Final   Special Requests BOTTLES DRAWN AEROBIC AND ANAEROBIC 10CC   Final   Culture  Setup Time     Final   Value: 02/14/2013 05:54     Performed at [facility]   Culture     Final   Value: STAPHYLOCOCCUS AUREUS     Note: RIFAMPIN AND GENTAMICIN SHOULD NOT BE USED AS SINGLE DRUGS FOR TREATMENT OF STAPH INFECTIONS. This organism is presumed to be Clindamycin resistant based on detection of inducible Clindamycin resistance.     Note: Gram Stain Report Called to,Read Back By and Verified With: KAYLIN REID ON 02/14/2013 AT 11:48P BY Serafina Mitchell     Performed at [facility]   Report Status 02/16/2013 FINAL   Final   Organism ID, Bacteria STAPHYLOCOCCUS AUREUS   Final  URINE CULTURE     Status: None   Collection Time    02/13/13 10:46 PM      Result Value Range Status   Specimen Description URINE, CLEAN CATCH   Final   Special Requests Normal   Final   Culture  Setup Time     Final   Value: 02/14/2013 01:11     Performed at [facility] Count     Final   Value: NO GROWTH     Performed at [facility]   Culture     Final   Value: NO GROWTH     Performed at [facility]   Report Status 02/15/2013 FINAL   Final  MRSA PCR SCREENING     Status: None   Collection Time    02/14/13  2:21 AM      Result Value Range Status   MRSA by PCR NEGATIVE  NEGATIVE Final   Comment:            The GeneXpert MRSA Assay (FDA     approved for NASAL specimens     only), is one component of a     comprehensive MRSA colonization     surveillance program. It is not     intended to diagnose MRSA     infection nor to guide or     monitor treatment for     MRSA infections.  CULTURE, BLOOD (ROUTINE X 2)     Status:  None   Collection Time    02/15/13  3:23 PM      Result Value Range Status   Specimen Description BLOOD RIGHT FOREARM   Final   Special Requests BOTTLES DRAWN AEROBIC ONLY 10CC   Final   Culture  Setup Time     Final   Value: 02/16/2013 01:44     Performed at [facility]   Culture     Final   Value:        BLOOD CULTURE RECEIVED NO GROWTH TO DATE CULTURE WILL BE HELD FOR 5 DAYS BEFORE ISSUING A FINAL NEGATIVE REPORT     Performed at [facility]   Report Status PENDING   Incomplete  CULTURE, BLOOD (ROUTINE X 2)     Status: None   Collection Time    02/15/13  3:30 PM      Result Value Range Status   Specimen Description BLOOD RIGHT HAND   Final   Special Requests BOTTLES DRAWN AEROBIC ONLY 3CC   Final   Culture  Setup Time     Final   Value: 02/16/2013 01:44     Performed at [facility]  Culture     Final   Value:        BLOOD CULTURE RECEIVED NO GROWTH TO DATE CULTURE WILL BE HELD FOR 5 DAYS BEFORE ISSUING A FINAL NEGATIVE REPORT     Performed at [facility]   Report Status PENDING   Incomplete     Studies: No results found.  Scheduled Meds: . atorvastatin  10 mg Oral QPM  .  ceFAZolin (ANCEF) IV  2 g Intravenous Q8H  . docusate sodium  100 mg Oral BID  . ibuprofen  600 mg Oral Q6H  . polyethylene glycol  17 g Oral Daily  . potassium chloride  40 mEq Oral Q4H  . sodium chloride  3 mL Intravenous Q12H   Continuous Infusions: . sodium chloride 75 mL/hr at 02/20/13 1610    Principal Problem:   Sepsis associated hypotension Active Problems:   Diabetes mellitus   Hypotension (arterial)   Staphylococcus aureus sepsis   Thrombocytopenia, unspecified   Septic thrombophlebitis    Time spent: 35 minutes    Jeralyn Bennett  [facility] Pager [phone]. If 7PM-7AM, please contact night-coverage at [email or web address], password [facility] 02/20/2013, 12:20 PM  LOS: 7 days

## 2013-02-20 NOTE — Progress Notes (Signed)
CRITICAL VALUE ALERT  Critical value received:  K+ 2.6  Date of notification:  10/28  Time of notification:  0758   Critical value read back:yes  Nurse who received alert:  B. Noelle Penner RN  MD notified (1st page): Vanessa Barbara MD  Time of first page:  0800  MD notified (2nd page):  Time of second page:  Responding MD:  Vanessa Barbara MD  Time MD responded:  [phone]

## 2013-02-20 NOTE — Progress Notes (Signed)
Physical Therapy Treatment Patient Details Name: Marie Stone MRN: [id number] DOB: [birth year] Today's Date: 02/20/2013 Time: 1191-4782 PT Time Calculation (min): 25 min  PT Assessment / Plan / Recommendation  History of Present Illness Pt admit for sepsis and thrombophlebitis of right thigh.     PT Comments   Pt progressing with gait with motivation.  Husband and pt educated on importance of walking instead of sitting all day once at home; educated on him being with pt for safety.  Pt performed functional mobility tasks safely.  Follow Up Recommendations  No PT follow up;Supervision/Assistance - 24 hour     Does the patient have the potential to tolerate intense rehabilitation     Barriers to Discharge        Equipment Recommendations  None recommended by PT    Recommendations for Other Services    Frequency Min 3X/week   Progress towards PT Goals Progress towards PT goals: Progressing toward goals  Plan      Precautions / Restrictions Precautions Precautions: Fall Restrictions Weight Bearing Restrictions: No   Pertinent Vitals/Pain 3/10 L LE pain this morning; repositioned post tx for comfort.    Mobility  Bed Mobility Bed Mobility: Rolling Right;Sitting - Scoot to Edge of Bed;Sit to Supine Rolling Right: 6: Modified independent (Device/Increase time) Right Sidelying to Sit: 6: Modified independent (Device/Increase time) Sitting - Scoot to Edge of Bed: 6: Modified independent (Device/Increase time) Sit to Supine: 6: Modified independent (Device/Increase time) Transfers Transfers: Sit to Stand;Stand to Sit Sit to Stand: 6: Modified independent (Device/Increase time);From bed;From toilet;With upper extremity assist Stand to Sit: 6: Modified independent (Device/Increase time);To toilet;To chair/3-in-1;With upper extremity assist;With armrests Ambulation/Gait Ambulation/Gait Assistance: 6: Modified independent (Device/Increase time) Ambulation Distance (Feet): 250  Feet Assistive device: Other (Comment) (IV Pole) Ambulation/Gait Assistance Details: Pt did well; became somewhat SOB requiring standing rest break Gait Pattern: Step-through pattern;Decreased stride length;Wide base of support Gait velocity: decreased General Gait Details: Pt required motivation to walk further today Stairs: Yes Stairs Assistance: 5: Supervision Stairs Assistance Details (indicate cue type and reason): Cues for safest technique Stair Management Technique: One rail Left Number of Stairs: 4 Wheelchair Mobility Wheelchair Mobility: No    Exercises     PT Diagnosis:    PT Problem List:   PT Treatment Interventions:     PT Goals (current goals can now be found in the care plan section)    Visit Information  Last PT Received On: 02/20/13 Assistance Needed: +1 History of Present Illness: Pt admit for sepsis and thrombophlebitis of right thigh.      Subjective Data      Cognition  Cognition Arousal/Alertness: Awake/alert Behavior During Therapy: WFL for tasks assessed/performed Overall Cognitive Status: Within Functional Limits for tasks assessed    Balance     End of Session PT - End of Session Equipment Utilized During Treatment: Gait belt Activity Tolerance: Patient tolerated treatment well Patient left: in chair;with call bell/phone within reach;with family/visitor present Nurse Communication: Mobility status   GP     Ernestina Columbia, SPTA 02/20/2013, 8:29 AM

## 2013-02-20 NOTE — Progress Notes (Signed)
Seen and agreed 02/20/2013 Machel Violante Elizabeth PTA [phone] pager [phone] office    

## 2013-02-21 DIAGNOSIS — E876 Hypokalemia: Secondary | ICD-10-CM | POA: Diagnosis not present

## 2013-02-21 DIAGNOSIS — I803 Phlebitis and thrombophlebitis of lower extremities, unspecified: Secondary | ICD-10-CM

## 2013-02-21 MED ORDER — POTASSIUM CHLORIDE CRYS ER 20 MEQ PO TBCR
40.0000 meq | EXTENDED_RELEASE_TABLET | Freq: Every day | ORAL | Status: DC
  Administered 2013-02-21 – 2013-02-22 (×2): 40 meq via ORAL
  Filled 2013-02-21 (×2): qty 2

## 2013-02-21 MED ORDER — MINERAL OIL RE ENEM
1.0000 | ENEMA | RECTAL | Status: DC | PRN
  Filled 2013-02-21: qty 1

## 2013-02-21 MED ORDER — OXYCODONE HCL 5 MG PO TABS: 5.0000 mg | ORAL_TABLET | ORAL | Status: DC | PRN

## 2013-02-21 MED ORDER — CEFAZOLIN SODIUM-DEXTROSE 2-3 GM-% IV SOLR: 2.0000 g | Freq: Three times a day (TID) | INTRAVENOUS | Status: DC

## 2013-02-21 MED ORDER — HEPARIN SOD (PORK) LOCK FLUSH 100 UNIT/ML IV SOLN
250.0000 [IU] | INTRAVENOUS | Status: AC | PRN
  Administered 2013-02-21: 500 [IU]

## 2013-02-21 MED ORDER — POTASSIUM CHLORIDE ER 10 MEQ PO TBCR: 20.0000 meq | EXTENDED_RELEASE_TABLET | Freq: Every day | ORAL | Status: DC

## 2013-02-21 MED ORDER — IBUPROFEN 200 MG PO TABS: 400.0000 mg | ORAL_TABLET | Freq: Three times a day (TID) | ORAL | Status: DC | PRN

## 2013-02-21 MED ORDER — FLEET ENEMA 7-19 GM/118ML RE ENEM
1.0000 | ENEMA | Freq: Every day | RECTAL | Status: DC | PRN
  Administered 2013-02-21: 1 via RECTAL
  Filled 2013-02-21 (×2): qty 1

## 2013-02-21 NOTE — Progress Notes (Addendum)
Progress Note:  Called to see pt regarding right leg thigh wound. It has been draining blood from small puncture wound distally. Pt on IV Ancef per ID. Area remains painful and swollen as it has been.  Dr. Edilia Bo has tried to probe wound - no purulent drainage noted.  VSS pt has been afebrile and WBC 10/28 WNL.  Right thigh with erythematous, indurated area around wound. This is tender to touch. Old bloody drainage only expressed from small punctate wound.  Discussed with Dr. Edilia Bo - continue antibiotics and use warm compresses. Wound may drain and we will see her in the office if this occurs to pack wound and begin HH packing of wound. He does not feel surgical I&D warranted at this time.  Will order warm compresses. If pt is still here in am MD will reassess.  ROCZNIAK,REGINA J 02/21/2013 1:02 PM  Please refer to my note. Waverly Ferrari, MD, FACS Beeper [phone] 02/21/2013

## 2013-02-21 NOTE — Progress Notes (Signed)
VASCULAR PROGRESS NOTE  SUBJECTIVE: Asked to check right thigh wound.   PHYSICAL EXAM: Filed Vitals:   02/20/13 1500 02/20/13 2059 02/21/13 0503 02/21/13 1452  BP: 166/78 167/71 158/69 163/70  Pulse: 88 66 94 65  Temp: 98.2 F (36.8 C) 98.4 F (36.9 C)  98.4 F (36.9 C)  TempSrc: Oral Oral Oral   Resp: 18 17 17 18   Height:      Weight:      SpO2: 98% 91% 91% 94%   Phlebitis with cellulitis and compromised skin.  LABS: Lab Results  Component Value Date   WBC 8.2 02/20/2013   HGB 11.6* 02/20/2013   HCT 33.4* 02/20/2013   MCV 89.1 02/20/2013   PLT 244 02/20/2013   CBG (last 3)   Recent Labs  02/19/13 2136 02/20/13 0757 02/20/13 1237  GLUCAP 130* 138* 133*    Principal Problem:   Sepsis associated hypotension Active Problems:   Diabetes mellitus   Hypotension (arterial)   Staphylococcus aureus sepsis   Thrombocytopenia, unspecified   Septic thrombophlebitis   Hypokalemia   ASSESSMENT AND PLAN:  * At the bedside, I I&D's the right thigh wound and drained some old hematoma, but no frank pus. I was able to pack this with a 4X4. I would anticipate some old bloody drainage for several days now, but this can be managed as an out-pt.  * I have ordered dressing changes and HHN for dsg changes at home. I will follow up in the office.   * Ok for D/C from my standpoint in AM unless any significant changes.   Cari Caraway Beeper: [phone] 02/21/2013

## 2013-02-21 NOTE — Progress Notes (Signed)
Chart reviewed.  Per Dr. Vanessa Barbara, patient to be discharge if potassium normalized.  K 3.5.  Patient requesting discharge.  Orders written, and I asked RN to change gauze dressing prior to discharge.  I was called back into room, because pt's husband reports wound much worse.  [facility] PROGRESS NOTE  Marie Stone ZOX:[id number] DOB: 1950-11-28 DOA: 02/13/2013 PCP: Dario Guardian                                                                      Assessment/Plan: Will ask vascular to re-evaluate wound.  No purulent drainage or fluctuance. Cancel discharge for now pending eval  Hypokalemia corrected.  Code Status: Full code Family Communication: Plan discussed with husband who was present at bedside Disposition Plan: Replace her potassium today, hopefully can: Home in the next 24-48 hours.    Consultants: Infectious disease Vascular surgery  Procedures:  Transthoracic echocardiogram performed on 02/12/2013 impression: Left ventricular ejection fraction estimated at 50-55%, without wall motion abnormalities, vegetation is absent  Antibiotics:  Ancef  HPI/Subjective: Pain unchanged from admission.  Objective: Filed Vitals:   02/21/13 0503  BP: 158/69  Pulse: 94  Temp:   Resp: 17    Intake/Output Summary (Last 24 hours) at 02/21/13 1413 Last data filed at 02/21/13 8119  Gross per 24 hour  Intake 1701.25 ml  Output    350 ml  Net 1351.25 ml   Filed Weights   02/13/13 2145 02/14/13 0213 02/14/13 0455  Weight: 95.255 kg (210 lb) 94.9 kg (209 lb 3.5 oz) 94.9 kg (209 lb 3.5 oz)    Exam:   General:  Patient alert, comfortable, nontoxic, in chair  Cardiovascular: Regular rate and rhythm normal S1 is  Respiratory: Lungs are clear to auscultation bilaterally  Abdomen: Soft nontender nondistended positive bowel  Musculoskeletal: erythema with blistering, right thigh, no fluctuance or purulent drainage.  Data Reviewed: Basic Metabolic  Panel:  Recent Labs Lab 02/15/13 0350 02/19/13 0452 02/19/13 1359 02/20/13 0540 02/20/13 1855  NA 133* 139  --  137  --   K 3.0* 2.5* 2.6* 2.6* 3.5  CL 100 101  --  100  --   CO2 19 30  --  30  --   GLUCOSE 123* 117*  --  111*  --   BUN 8 6  --  5*  --   CREATININE 0.56 0.48*  --  0.49*  --   CALCIUM 7.5* 7.9*  --  8.3*  --   MG 1.6  --   --   --   --    Liver Function Tests: No results found for this basename: AST, ALT, ALKPHOS, BILITOT, PROT, ALBUMIN,  in the last 168 hours No results found for this basename: LIPASE, AMYLASE,  in the last 168 hours No results found for this basename: AMMONIA,  in the last 168 hours CBC:  Recent Labs Lab 02/16/13 0600 02/17/13 0419 02/18/13 0900 02/19/13 0452 02/20/13 0540  WBC 8.5 8.3 9.6 10.6* 8.2  HGB 12.7 12.3 12.4 11.7* 11.6*  HCT 36.4 34.9* 35.1* 34.3* 33.4*  MCV 88.1 87.9 87.1 88.4 89.1  PLT 122* 141* 191 220 244   Cardiac Enzymes: No results found for this basename: CKTOTAL, CKMB, CKMBINDEX, TROPONINI,  in  the last 168 hours BNP (last 3 results) No results found for this basename: PROBNP,  in the last 8760 hours CBG:  Recent Labs Lab 02/19/13 1224 02/19/13 1707 02/19/13 2136 02/20/13 0757 02/20/13 1237  GLUCAP 102* 124* 130* 138* 133*    Recent Results (from the past 240 hour(s))  CULTURE, BLOOD (SINGLE)     Status: None   Collection Time    02/13/13 10:30 PM      Result Value Range Status   Specimen Description BLOOD LEFT HAND   Final   Special Requests BOTTLES DRAWN AEROBIC AND ANAEROBIC 10CC   Final   Culture  Setup Time     Final   Value: 02/14/2013 05:54     Performed at [facility]   Culture     Final   Value: STAPHYLOCOCCUS AUREUS     Note: RIFAMPIN AND GENTAMICIN SHOULD NOT BE USED AS SINGLE DRUGS FOR TREATMENT OF STAPH INFECTIONS. This organism is presumed to be Clindamycin resistant based on detection of inducible Clindamycin resistance.     Note: Gram Stain Report Called to,Read Back By  and Verified With: KAYLIN REID ON 02/14/2013 AT 11:48P BY Serafina Mitchell     Performed at [facility]   Report Status 02/16/2013 FINAL   Final   Organism ID, Bacteria STAPHYLOCOCCUS AUREUS   Final  URINE CULTURE     Status: None   Collection Time    02/13/13 10:46 PM      Result Value Range Status   Specimen Description URINE, CLEAN CATCH   Final   Special Requests Normal   Final   Culture  Setup Time     Final   Value: 02/14/2013 01:11     Performed at [facility] Count     Final   Value: NO GROWTH     Performed at [facility]   Culture     Final   Value: NO GROWTH     Performed at [facility]   Report Status 02/15/2013 FINAL   Final  MRSA PCR SCREENING     Status: None   Collection Time    02/14/13  2:21 AM      Result Value Range Status   MRSA by PCR NEGATIVE  NEGATIVE Final   Comment:            The GeneXpert MRSA Assay (FDA     approved for NASAL specimens     only), is one component of a     comprehensive MRSA colonization     surveillance program. It is not     intended to diagnose MRSA     infection nor to guide or     monitor treatment for     MRSA infections.  CULTURE, BLOOD (ROUTINE X 2)     Status: None   Collection Time    02/15/13  3:23 PM      Result Value Range Status   Specimen Description BLOOD RIGHT FOREARM   Final   Special Requests BOTTLES DRAWN AEROBIC ONLY 10CC   Final   Culture  Setup Time     Final   Value: 02/16/2013 01:44     Performed at [facility]   Culture     Final   Value:        BLOOD CULTURE RECEIVED NO GROWTH TO DATE CULTURE WILL BE HELD FOR 5 DAYS BEFORE ISSUING A FINAL NEGATIVE REPORT     Performed at  [facility]   Report Status PENDING   Incomplete  CULTURE, BLOOD (ROUTINE X 2)     Status: None   Collection Time    02/15/13  3:30 PM      Result Value Range Status   Specimen Description BLOOD RIGHT HAND   Final   Special Requests BOTTLES DRAWN AEROBIC ONLY 3CC   Final    Culture  Setup Time     Final   Value: 02/16/2013 01:44     Performed at [facility]   Culture     Final   Value:        BLOOD CULTURE RECEIVED NO GROWTH TO DATE CULTURE WILL BE HELD FOR 5 DAYS BEFORE ISSUING A FINAL NEGATIVE REPORT     Performed at [facility]   Report Status PENDING   Incomplete     Studies: No results found.  Scheduled Meds: . atorvastatin  10 mg Oral QPM  .  ceFAZolin (ANCEF) IV  2 g Intravenous Q8H  . docusate sodium  100 mg Oral BID  . ibuprofen  600 mg Oral Q6H  . polyethylene glycol  17 g Oral Daily  . potassium chloride  40 mEq Oral Daily  . sodium chloride  3 mL Intravenous Q12H   Continuous Infusions:    Time spent: 45 minutes  Christiane Ha, MD [facility] Pager [phone]. If 7PM-7AM, please contact night-coverage at [email or web address], password [facility] 02/21/2013, 2:13 PM  LOS: 8 days

## 2013-02-22 ENCOUNTER — Inpatient Hospital Stay (HOSPITAL_COMMUNITY): Payer: BC Managed Care – PPO

## 2013-02-22 DIAGNOSIS — A4101 Sepsis due to Methicillin susceptible Staphylococcus aureus: Secondary | ICD-10-CM

## 2013-02-22 DIAGNOSIS — R112 Nausea with vomiting, unspecified: Secondary | ICD-10-CM | POA: Diagnosis not present

## 2013-02-22 DIAGNOSIS — A419 Sepsis, unspecified organism: Secondary | ICD-10-CM

## 2013-02-22 DIAGNOSIS — E119 Type 2 diabetes mellitus without complications: Secondary | ICD-10-CM

## 2013-02-22 DIAGNOSIS — E785 Hyperlipidemia, unspecified: Secondary | ICD-10-CM

## 2013-02-22 DIAGNOSIS — I809 Phlebitis and thrombophlebitis of unspecified site: Secondary | ICD-10-CM

## 2013-02-22 LAB — BASIC METABOLIC PANEL
BUN: 4 mg/dL — ABNORMAL LOW (ref 6–23)
CO2: 25 mEq/L (ref 19–32)
Chloride: 100 mEq/L (ref 96–112)
Creatinine, Ser: 0.43 mg/dL — ABNORMAL LOW (ref 0.50–1.10)
Glucose, Bld: 107 mg/dL — ABNORMAL HIGH (ref 70–99)
Potassium: 3.6 mEq/L (ref 3.5–5.1)

## 2013-02-22 LAB — HEPATIC FUNCTION PANEL
AST: 73 U/L — ABNORMAL HIGH (ref 0–37)
Albumin: 2.4 g/dL — ABNORMAL LOW (ref 3.5–5.2)
Bilirubin, Direct: 0.2 mg/dL (ref 0.0–0.3)
Indirect Bilirubin: 0.4 mg/dL (ref 0.3–0.9)
Total Protein: 6.4 g/dL (ref 6.0–8.3)

## 2013-02-22 LAB — CULTURE, BLOOD (ROUTINE X 2): Culture: NO GROWTH

## 2013-02-22 LAB — CBC WITH DIFFERENTIAL/PLATELET
Basophils Relative: 0 % (ref 0–1)
Eosinophils Relative: 2 % (ref 0–5)
HCT: 35.1 % — ABNORMAL LOW (ref 36.0–46.0)
Hemoglobin: 11.8 g/dL — ABNORMAL LOW (ref 12.0–15.0)
Lymphocytes Relative: 17 % (ref 12–46)
Lymphs Abs: 1.6 10*3/uL (ref 0.7–4.0)
MCHC: 33.6 g/dL (ref 30.0–36.0)
MCV: 90.9 fL (ref 78.0–100.0)
Monocytes Relative: 7 % (ref 3–12)
Neutro Abs: 7 10*3/uL (ref 1.7–7.7)
WBC: 9.5 10*3/uL (ref 4.0–10.5)

## 2013-02-22 MED ORDER — METOPROLOL TARTRATE 1 MG/ML IV SOLN
5.0000 mg | Freq: Four times a day (QID) | INTRAVENOUS | Status: DC | PRN
  Administered 2013-02-22 – 2013-02-23 (×3): 5 mg via INTRAVENOUS
  Filled 2013-02-22 (×2): qty 5

## 2013-02-22 MED ORDER — METOCLOPRAMIDE HCL 5 MG/ML IJ SOLN
5.0000 mg | Freq: Four times a day (QID) | INTRAMUSCULAR | Status: AC
  Administered 2013-02-22 – 2013-02-23 (×4): 5 mg via INTRAVENOUS
  Filled 2013-02-22 (×3): qty 2

## 2013-02-22 MED ORDER — METOCLOPRAMIDE HCL 5 MG/ML IJ SOLN: 5.0000 mg | Freq: Four times a day (QID) | INTRAMUSCULAR | Status: DC

## 2013-02-22 MED ORDER — HYDROCODONE-ACETAMINOPHEN 5-325 MG PO TABS
1.0000 | ORAL_TABLET | Freq: Four times a day (QID) | ORAL | Status: DC | PRN
  Administered 2013-02-23 (×2): 2 via ORAL
  Filled 2013-02-22 (×3): qty 2

## 2013-02-22 MED ORDER — HYDROMORPHONE HCL PF 1 MG/ML IJ SOLN
0.5000 mg | INTRAMUSCULAR | Status: DC | PRN
  Administered 2013-02-22: 0.5 mg via INTRAVENOUS
  Filled 2013-02-22: qty 1

## 2013-02-22 NOTE — Progress Notes (Signed)
Physical Therapy Discharge Patient Details Name: BRYLA BUREK MRN: [id number] DOB: [birth year] Today's Date: 02/22/2013 Time: 4098-1191 PT Time Calculation (min): 24 min  Patient discharged from PT services secondary to goals met and no further PT needs identified.  Please see latest therapy progress note for current level of functioning and progress toward goals.    Progress and discharge plan discussed with patient and/or caregiver: Patient/Caregiver agrees with plan  GP     Fabio Asa 02/22/2013, 9:25 AM

## 2013-02-22 NOTE — Progress Notes (Signed)
Physical Therapy Treatment Patient Details Name: Marie Stone MRN: [id number] DOB: [birth year] Today's Date: 02/22/2013 Time: 6578-4696 PT Time Calculation (min): 24 min  PT Assessment / Plan / Recommendation  History of Present Illness Pt admit for sepsis and thrombophlebitis of right thigh.     PT Comments   Patient ambulating around unit with spouse.  Patient tolerated ambulation without difficulty with and without assistive device. At this time patient at Mod-I for ambulation and mobility. Goals met. Discussed expectations for mobility moving forward. Encouraged OOB activity frequently. Also Spoke with patient about mobility at home.  Patient reported increased Nausea today and no appetite. At this time, patient demonstrates no further acute PT needs, will continue to ambulate with spouse. Acute PT will sign off. Please re consult if patient digresses.  Follow Up Recommendations  No PT follow up;Supervision/Assistance - 24 hour           Equipment Recommendations  None recommended by PT       Frequency Min 3X/week   Progress towards PT Goals Progress towards PT goals: Goals met/education completed, patient discharged from PT  Plan Current plan remains appropriate    Precautions / Restrictions Precautions Precautions: Fall Restrictions Weight Bearing Restrictions: No   Pertinent Vitals/Pain No pain, just nausea    Mobility  Bed Mobility Bed Mobility: Rolling Right;Sitting - Scoot to Edge of Bed;Sit to Supine Rolling Right: 6: Modified independent (Device/Increase time) Right Sidelying to Sit: 6: Modified independent (Device/Increase time) Sitting - Scoot to Edge of Bed: 6: Modified independent (Device/Increase time) Sit to Supine: 6: Modified independent (Device/Increase time) Transfers Transfers: Sit to Stand;Stand to Sit Sit to Stand: 6: Modified independent (Device/Increase time);From bed;From toilet;With upper extremity assist Stand to Sit: 6: Modified independent  (Device/Increase time);To toilet;To chair/3-in-1;With upper extremity assist;With armrests Ambulation/Gait Ambulation/Gait Assistance: 6: Modified independent (Device/Increase time) Ambulation Distance (Feet): 600 Feet Assistive device: Other (Comment) (IV Pole; 100 ft without any device today) Ambulation/Gait Assistance Details: Patient able to ambulate entire unit without need for rest break today.  Gait Pattern: Step-through pattern;Decreased stride length;Wide base of support Gait velocity: decreased General Gait Details: Pt required motivation to walk further today Stairs: Yes Stairs Assistance: 5: Supervision Stairs Assistance Details (indicate cue type and reason): no difficulty Stair Management Technique: One rail Left Number of Stairs: 4 Wheelchair Mobility Wheelchair Mobility: No      PT Goals (current goals can now be found in the care plan section) Acute Rehab PT Goals Patient Stated Goal: to go home  Visit Information  Last PT Received On: 02/22/13 Assistance Needed: +1 History of Present Illness: Pt admit for sepsis and thrombophlebitis of right thigh.      Subjective Data  Patient Stated Goal: to go home   Cognition  Cognition Arousal/Alertness: Awake/alert Behavior During Therapy: WFL for tasks assessed/performed Overall Cognitive Status: Within Functional Limits for tasks assessed    Balance   Steady with multiple balance activities  End of Session PT - End of Session Equipment Utilized During Treatment: Gait belt Activity Tolerance: Patient tolerated treatment well Patient left: in chair;with call bell/phone within reach;with family/visitor present Nurse Communication: Mobility status   GP     Fabio Asa 02/22/2013, 9:21 AM [name], PT DPT  [phone]

## 2013-02-22 NOTE — Progress Notes (Signed)
[facility] PROGRESS NOTE  Marie Stone AOZ:[id number] DOB: 09-Nov-1950 DOA: 02/13/2013 PCP: Dario Guardian                                                                      Assessment/Plan: Septic thrombophlebitis:  I&D by Dr. Durwin Nora 10/30 and cleared for discharge, but patient started vomiting yesterday and remains nauseated, unable to eat.  Appreciate vascular.  N/V. Had a stool after fleets, but remains nauseated.  No abd pain. Will check abd xrays r/o PSBO or ileus.  Check lfts, lipase.  Add scheduled low dose reglan x4 doses, hold all po meds.    Hypokalemia corrected.  MSSA sepsis.  DM ok  Code Status: Full code Family Communication: Plan discussed with husband who was present at bedside Disposition Plan: home when vomiting resolved with home RN    Consultants: Infectious disease Vascular surgery  Procedures:  Transthoracic echocardiogram performed on 02/12/2013 impression: Left ventricular ejection fraction estimated at 50-55%, without wall motion abnormalities, vegetation is absent  Antibiotics:  Ancef  HPI/Subjective: Pain unchanged from admission.  Objective: Filed Vitals:   02/22/13 0600  BP: 172/62  Pulse: 69  Temp:   Resp:     Intake/Output Summary (Last 24 hours) at 02/22/13 1032 Last data filed at 02/22/13 0615  Gross per 24 hour  Intake    420 ml  Output     80 ml  Net    340 ml   Filed Weights   02/13/13 2145 02/14/13 0213 02/14/13 0455  Weight: 95.255 kg (210 lb) 94.9 kg (209 lb 3.5 oz) 94.9 kg (209 lb 3.5 oz)    Exam:   General:  Alert. uncomfotable in bed.  Husband answers most questions.  Cardiovascular: Regular rate and rhythm normal S1 is  Respiratory: Lungs are clear to auscultation bilaterally  Abdomen: Soft nontender nondistended positive bowel  Musculoskeletal: erythema with blistering, right thigh, diminished. Scant bloody drainage  Data Reviewed: Basic Metabolic Panel:  Recent Labs Lab  02/19/13 0452 02/19/13 1359 02/20/13 0540 02/20/13 1855 02/22/13 0455  NA 139  --  137  --  135  K 2.5* 2.6* 2.6* 3.5 3.6  CL 101  --  100  --  100  CO2 30  --  30  --  25  GLUCOSE 117*  --  111*  --  107*  BUN 6  --  5*  --  4*  CREATININE 0.48*  --  0.49*  --  0.43*  CALCIUM 7.9*  --  8.3*  --  8.4  MG  --   --   --   --  1.9   Liver Function Tests: No results found for this basename: AST, ALT, ALKPHOS, BILITOT, PROT, ALBUMIN,  in the last 168 hours No results found for this basename: LIPASE, AMYLASE,  in the last 168 hours No results found for this basename: AMMONIA,  in the last 168 hours CBC:  Recent Labs Lab 02/17/13 0419 02/18/13 0900 02/19/13 0452 02/20/13 0540 02/22/13 0455  WBC 8.3 9.6 10.6* 8.2 9.5  NEUTROABS  --   --   --   --  7.0  HGB 12.3 12.4 11.7* 11.6* 11.8*  HCT 34.9* 35.1* 34.3* 33.4* 35.1*  MCV 87.9 87.1 88.4  89.1 90.9  PLT 141* 191 220 244 264   Cardiac Enzymes: No results found for this basename: CKTOTAL, CKMB, CKMBINDEX, TROPONINI,  in the last 168 hours BNP (last 3 results) No results found for this basename: PROBNP,  in the last 8760 hours CBG:  Recent Labs Lab 02/19/13 1224 02/19/13 1707 02/19/13 2136 02/20/13 0757 02/20/13 1237  GLUCAP 102* 124* 130* 138* 133*    Recent Results (from the past 240 hour(s))  CULTURE, BLOOD (SINGLE)     Status: None   Collection Time    02/13/13 10:30 PM      Result Value Range Status   Specimen Description BLOOD LEFT HAND   Final   Special Requests BOTTLES DRAWN AEROBIC AND ANAEROBIC 10CC   Final   Culture  Setup Time     Final   Value: 02/14/2013 05:54     Performed at [facility]   Culture     Final   Value: STAPHYLOCOCCUS AUREUS     Note: RIFAMPIN AND GENTAMICIN SHOULD NOT BE USED AS SINGLE DRUGS FOR TREATMENT OF STAPH INFECTIONS. This organism is presumed to be Clindamycin resistant based on detection of inducible Clindamycin resistance.     Note: Gram Stain Report Called to,Read  Back By and Verified With: KAYLIN REID ON 02/14/2013 AT 11:48P BY Serafina Mitchell     Performed at [facility]   Report Status 02/16/2013 FINAL   Final   Organism ID, Bacteria STAPHYLOCOCCUS AUREUS   Final  URINE CULTURE     Status: None   Collection Time    02/13/13 10:46 PM      Result Value Range Status   Specimen Description URINE, CLEAN CATCH   Final   Special Requests Normal   Final   Culture  Setup Time     Final   Value: 02/14/2013 01:11     Performed at [facility] Count     Final   Value: NO GROWTH     Performed at [facility]   Culture     Final   Value: NO GROWTH     Performed at [facility]   Report Status 02/15/2013 FINAL   Final  MRSA PCR SCREENING     Status: None   Collection Time    02/14/13  2:21 AM      Result Value Range Status   MRSA by PCR NEGATIVE  NEGATIVE Final   Comment:            The GeneXpert MRSA Assay (FDA     approved for NASAL specimens     only), is one component of a     comprehensive MRSA colonization     surveillance program. It is not     intended to diagnose MRSA     infection nor to guide or     monitor treatment for     MRSA infections.  CULTURE, BLOOD (ROUTINE X 2)     Status: None   Collection Time    02/15/13  3:23 PM      Result Value Range Status   Specimen Description BLOOD RIGHT FOREARM   Final   Special Requests BOTTLES DRAWN AEROBIC ONLY 10CC   Final   Culture  Setup Time     Final   Value: 02/16/2013 01:44     Performed at [facility]   Culture     Final   Value: NO GROWTH 5 DAYS  Performed at [facility]   Report Status 02/22/2013 FINAL   Final  CULTURE, BLOOD (ROUTINE X 2)     Status: None   Collection Time    02/15/13  3:30 PM      Result Value Range Status   Specimen Description BLOOD RIGHT HAND   Final   Special Requests BOTTLES DRAWN AEROBIC ONLY 3CC   Final   Culture  Setup Time     Final   Value: 02/16/2013 01:44     Performed at [facility]   Culture     Final   Value: NO GROWTH 5 DAYS     Performed at [facility]   Report Status 02/22/2013 FINAL   Final     Studies: No results found.  Scheduled Meds: .  ceFAZolin (ANCEF) IV  2 g Intravenous Q8H  . metoCLOPramide (REGLAN) injection  5 mg Intravenous Q6H  . polyethylene glycol  17 g Oral Daily  . sodium chloride  3 mL Intravenous Q12H   Continuous Infusions:    Time spent: 45 minutes  Christiane Ha, MD [facility] Pager [phone]. If 7PM-7AM, please contact night-coverage at [email or web address], password [facility] 02/22/2013, 10:32 AM  LOS: 9 days

## 2013-02-23 MED ORDER — ACETAMINOPHEN 325 MG PO TABS: 650.0000 mg | ORAL_TABLET | Freq: Four times a day (QID) | ORAL | Status: DC | PRN

## 2013-02-23 MED ORDER — TRAMADOL HCL 50 MG PO TABS: 50.0000 mg | ORAL_TABLET | Freq: Four times a day (QID) | ORAL | Status: DC | PRN

## 2013-02-23 MED ORDER — METOCLOPRAMIDE HCL 5 MG PO TABS
5.0000 mg | ORAL_TABLET | Freq: Three times a day (TID) | ORAL | Status: DC
Start: 2013-02-23 — End: 2013-02-23
  Administered 2013-02-23: 5 mg via ORAL
  Filled 2013-02-23: qty 1

## 2013-02-23 MED ORDER — ENSURE COMPLETE PO LIQD
237.0000 mL | Freq: Three times a day (TID) | ORAL | Status: DC
  Administered 2013-02-23: 237 mL via ORAL

## 2013-02-23 MED ORDER — CEFAZOLIN SODIUM-DEXTROSE 2-3 GM-% IV SOLR: 2.0000 g | Freq: Three times a day (TID) | INTRAVENOUS | Status: DC

## 2013-02-23 MED ORDER — HEPARIN SOD (PORK) LOCK FLUSH 100 UNIT/ML IV SOLN
250.0000 [IU] | INTRAVENOUS | Status: AC | PRN
  Administered 2013-02-23: 250 [IU]

## 2013-02-23 MED ORDER — PROMETHAZINE HCL 12.5 MG PO TABS: 12.5000 mg | ORAL_TABLET | Freq: Four times a day (QID) | ORAL | Status: DC | PRN

## 2013-02-23 NOTE — Discharge Summary (Signed)
Physician Discharge Summary  Marie Stone NWG:[id number] DOB: 05-15-1950 DOA: 02/13/2013  PCP: Dario Guardian  Admit date: 02/13/2013 Discharge date: 02/23/2013  Time spent: greater than 30 minutes  Recommendations for Outpatient Follow-up:  1. Home health for bid wound packing and IV antibiotics 2. CBC and CMET weekly  Discharge Diagnoses:     Sepsis associated hypotension    Diabetes mellitus   Staphylococcus aureus sepsis, methicillin sensitive    Thrombocytopenia, unspecified    Septic thrombophlebitis    Hypokalemia    Nausea and vomiting, medication related  obesity   Discharge Condition: stable  Filed Weights   02/13/13 2145 02/14/13 0213 02/14/13 0455  Weight: 95.255 kg (210 lb) 94.9 kg (209 lb 3.5 oz) 94.9 kg (209 lb 3.5 oz)    History of present illness:  62 y.o. female with hx of DM on oral hyperglycemia agent, presents to the ER with altered mental status, rigor, and temp of 103 at home. She apparently had vein stripping about 3 weeks ago, and developed pain and swelling in the area of her right thigh. Reportedly, she had ultrasound, which questioned "leakage" and she had another procedure yesterday. Tonight, she has increase pain, can't walk, having chills, and temp of 103 at home. Evaluation in the ER showed hypotension with SBP of 80's, responded to 3L IVF, a leukocytosis with WBC of 16K, normal renal fx tests. Her lactic acid was elevated also. The US of the area showed ? Thrombus in a tortuous soft tissue, infection not excluded. Her CXR and UA were negative. She was started on IV VAN/IV Unasyn, and hospitalist was asked to admit her for sepsis, likely from her right thigh from recent surgery.  Hospital Course:  Patient admitted to Step down unit.  Started on broad spectrum antibiotics.  Workup significant for MSSA sepsis.  ID consulted. TTE showed no vegetation.  ID recommends 4 weeks ancef.  PICC line placed.  Vascular surgery consulted.  Initially,  wound did not require I&D, but with worsened pain and swelling, Dr. Edilia Bo performed bedside I&D.  Evacuated blood clot.  No pus.  Patient's course complicated by hypokalemia which was corrected. Also nausea and vomiting related to pain medications.  After adjustments in pain control, patient is feeling better and tolerating a regular diet, requesting discharge  Procedures:  picc line  Bedside I&D of right thigh hematoma by Dr. Edilia Bo 02/21/13  Consultations:  ID  Vascular surgery  Discharge Exam: Filed Vitals:   02/23/13 0650  BP: 178/73  Pulse: 63  Temp:   Resp:     General: comfortable Cardiovascular: RRR without MGR Respiratory: CTA without WRR Abd: S, NT, ND. Bowel sounds present Ext: right thigh wound with no purulent drainage, improving surrounding erythema  Discharge Instructions  Discharge Orders   Future Appointments Provider Department Dept Phone   03/01/2013 4:30 PM Gardiner Barefoot, MD [facility] [phone]   04/25/2013 9:45 AM Chuck Hint, MD [facility] [phone]   Future Orders Complete By Expires   Diet Carb Modified  As directed    Discharge wound care:  As directed    Comments:     Keep dressing clean and dry. Change as needed.   Discharge wound care:  As directed    Comments:     Pack with new gauze twice daily and cover with gauze dressing   Increase activity slowly  As directed        Medication List  acetaminophen 325 MG tablet  Commonly known as:  TYLENOL  Take 2 tablets (650 mg total) by mouth every 6 (six) hours as needed.     atorvastatin 10 MG tablet  Commonly known as:  LIPITOR  Take 10 mg by mouth every evening.     ceFAZolin 2-3 GM-% Solr  Commonly known as:  ANCEF  Inject 50 mLs (2 g total) into the vein every 8 (eight) hours. Through 03/14/13     INVOKANA 100 MG Tabs  Generic drug:  Canagliflozin  Take 200 mg by mouth daily.      promethazine 12.5 MG tablet  Commonly known as:  PHENERGAN  Take 1 tablet (12.5 mg total) by mouth every 6 (six) hours as needed for nausea.     sitaGLIPtin 100 MG tablet  Commonly known as:  JANUVIA  Take 100 mg by mouth daily.     traMADol 50 MG tablet  Commonly known as:  ULTRAM  Take 1 tablet (50 mg total) by mouth every 6 (six) hours as needed.       No Known Allergies     Follow-up Information   Follow up with MICHAELS,CHASE, PA-C. (As needed)    Specialty:  General Practice   Contact information:   [address] [place] [address] [phone]       Follow up with DICKSON,CHRISTOPHER S, MD. Schedule an appointment as soon as possible for a visit in 1 week.   Specialty:  Vascular Surgery   Contact information:   [address] [place] [address] [phone]        The results of significant diagnostics from this hospitalization (including imaging, microbiology, ancillary and laboratory) are listed below for reference.    Significant Diagnostic Studies: [place] Extrem Low Right Ltd  02/14/2013   CLINICAL DATA:  Vein stripping 2 weeks ago. Now complaining of a large hard ropy mass.  EXAM: RIGHT LOWER EXTREMITY SOFT TISSUE ULTRASOUND LIMITED  TECHNIQUE: Ultrasound examination was performed including evaluation of the muscles, tendons, joint, and adjacent soft tissues.  COMPARISON:  Ultrasound venous reflux study 12/13/2007  FINDINGS: Ultrasound imaging of the inner posterior right thigh was obtained over the area of palpable abnormality. A tubular complex curvilinear mass lesion is demonstrated in the subcutaneous tissues with heterogeneous internal architecture. Changes likely represent dilated tortuous thrombosed venous structures. Flow is demonstrated in the area on color flow Doppler imaging. Infection is not excluded.  IMPRESSION: Complex soft tissue mass lesion demonstrated corresponding to palpable abnormality. Changes likely to represent dilated tortuous  thrombosed venous structures. Infection is not excluded.   Electronically Signed   By: Burman Nieves M.D.   On: 02/14/2013 00:27   Dg Chest Port 1 View  02/13/2013   CLINICAL DATA:  Leg pain  EXAM: PORTABLE CHEST - 1 VIEW  COMPARISON:  CT chest 06/28/2006  FINDINGS: Heart size upper normal. Vascularity normal. Negative for infiltrate effusion or mass.  IMPRESSION: No active disease.   Electronically Signed   By: Marlan Palau M.D.   On: 02/13/2013 22:46   Dg Abd 2 Views  02/22/2013   CLINICAL DATA:  vomiting, constipation. r/o obstruction or ileus  EXAM: ABDOMEN - 2 VIEW  COMPARISON:  None.  FINDINGS: No dilated loops of large or small bowel. There is gas the rectum. No intraperitoneal free air. There is bilateral pleural effusions. Calcifications in the right mid abdomen are indeterminate.  IMPRESSION: No evidence of bowel obstruction.  Bilateral effusions.   Electronically Signed   By: Roseanne Reno  Amil Amen M.D.   On: 02/22/2013 15:23   Echo Left ventricle: The cavity size was normal. Systolic function was normal. The estimated ejection fraction was in the range of 50% to 55%. Wall motion was normal; there were no regional wall motion abnormalities. - Left atrium: The atrium was mildly dilated. - Pulmonary arteries: Systolic pressure was mildly to moderately increased. PA peak pressure: 46mm Hg (S). Impressions:  - Vegetation is absent.  Microbiology: Recent Results (from the past 240 hour(s))  CULTURE, BLOOD (SINGLE)     Status: None   Collection Time    02/13/13 10:30 PM      Result Value Range Status   Specimen Description BLOOD LEFT HAND   Final   Special Requests BOTTLES DRAWN AEROBIC AND ANAEROBIC 10CC   Final   Culture  Setup Time     Final   Value: 02/14/2013 05:54     Performed at [facility]   Culture     Final   Value: STAPHYLOCOCCUS AUREUS     Note: RIFAMPIN AND GENTAMICIN SHOULD NOT BE USED AS SINGLE DRUGS FOR TREATMENT OF STAPH INFECTIONS. This organism is  presumed to be Clindamycin resistant based on detection of inducible Clindamycin resistance.     Note: Gram Stain Report Called to,Read Back By and Verified With: KAYLIN REID ON 02/14/2013 AT 11:48P BY Serafina Mitchell     Performed at [facility]   Report Status 02/16/2013 FINAL   Final   Organism ID, Bacteria STAPHYLOCOCCUS AUREUS   Final  URINE CULTURE     Status: None   Collection Time    02/13/13 10:46 PM      Result Value Range Status   Specimen Description URINE, CLEAN CATCH   Final   Special Requests Normal   Final   Culture  Setup Time     Final   Value: 02/14/2013 01:11     Performed at [facility] Count     Final   Value: NO GROWTH     Performed at [facility]   Culture     Final   Value: NO GROWTH     Performed at [facility]   Report Status 02/15/2013 FINAL   Final  MRSA PCR SCREENING     Status: None   Collection Time    02/14/13  2:21 AM      Result Value Range Status   MRSA by PCR NEGATIVE  NEGATIVE Final   Comment:            The GeneXpert MRSA Assay (FDA     approved for NASAL specimens     only), is one component of a     comprehensive MRSA colonization     surveillance program. It is not     intended to diagnose MRSA     infection nor to guide or     monitor treatment for     MRSA infections.  CULTURE, BLOOD (ROUTINE X 2)     Status: None   Collection Time    02/15/13  3:23 PM      Result Value Range Status   Specimen Description BLOOD RIGHT FOREARM   Final   Special Requests BOTTLES DRAWN AEROBIC ONLY 10CC   Final   Culture  Setup Time     Final   Value: 02/16/2013 01:44     Performed at [facility]   Culture     Final   Value: NO GROWTH 5  DAYS     Performed at [facility]   Report Status 02/22/2013 FINAL   Final  CULTURE, BLOOD (ROUTINE X 2)     Status: None   Collection Time    02/15/13  3:30 PM      Result Value Range Status   Specimen Description BLOOD RIGHT HAND   Final   Special  Requests BOTTLES DRAWN AEROBIC ONLY 3CC   Final   Culture  Setup Time     Final   Value: 02/16/2013 01:44     Performed at [facility]   Culture     Final   Value: NO GROWTH 5 DAYS     Performed at [facility]   Report Status 02/22/2013 FINAL   Final     Labs: Basic Metabolic Panel:  Recent Labs Lab 02/19/13 0452 02/19/13 1359 02/20/13 0540 02/20/13 1855 02/22/13 0455  NA 139  --  137  --  135  K 2.5* 2.6* 2.6* 3.5 3.6  CL 101  --  100  --  100  CO2 30  --  30  --  25  GLUCOSE 117*  --  111*  --  107*  BUN 6  --  5*  --  4*  CREATININE 0.48*  --  0.49*  --  0.43*  CALCIUM 7.9*  --  8.3*  --  8.4  MG  --   --   --   --  1.9   Liver Function Tests:  Recent Labs Lab 02/22/13 0455  AST 73*  ALT 39*  ALKPHOS 139*  BILITOT 0.6  PROT 6.4  ALBUMIN 2.4*    Recent Labs Lab 02/22/13 0455  LIPASE 18   No results found for this basename: AMMONIA,  in the last 168 hours CBC:  Recent Labs Lab 02/17/13 0419 02/18/13 0900 02/19/13 0452 02/20/13 0540 02/22/13 0455  WBC 8.3 9.6 10.6* 8.2 9.5  NEUTROABS  --   --   --   --  7.0  HGB 12.3 12.4 11.7* 11.6* 11.8*  HCT 34.9* 35.1* 34.3* 33.4* 35.1*  MCV 87.9 87.1 88.4 89.1 90.9  PLT 141* 191 220 244 264   Cardiac Enzymes: No results found for this basename: CKTOTAL, CKMB, CKMBINDEX, TROPONINI,  in the last 168 hours BNP: BNP (last 3 results) No results found for this basename: PROBNP,  in the last 8760 hours CBG:  Recent Labs Lab 02/19/13 1224 02/19/13 1707 02/19/13 2136 02/20/13 0757 02/20/13 1237  GLUCAP 102* 124* 130* 138* 133*       Signed:  Debie Ashline L  [facility] 02/23/2013, 1:26 PM

## 2013-02-23 NOTE — Progress Notes (Signed)
Discharge home. Home discharge instruction given, no question verbalized. Home health needs addressed, advance aware of the discharge.

## 2013-02-23 NOTE — Progress Notes (Signed)
ANTIBIOTIC CONSULT NOTE - FOLLOW UP  Pharmacy Consult for Ancef Indication: MSSA bacteremia and superficial thrombophlebitis of R-thigh  No Known Allergies  Patient Measurements: Height: 5\' 5"  (165.1 cm) Weight: 209 lb 3.5 oz (94.9 kg) IBW/kg (Calculated) : 57  Vital Signs: Temp: 98.4 F (36.9 C) (10/31 1331) Temp src: Oral (10/31 1331) BP: 170/73 mmHg (10/31 1331) Pulse Rate: 65 (10/31 1331) Intake/Output from previous day: 10/30 0701 - 10/31 0700 In: 550 [P.O.:400; IV Piggyback:150] Out: -  Intake/Output from this shift:    Labs:  Recent Labs  02/22/13 0455  WBC 9.5  HGB 11.8*  PLT 264  CREATININE 0.43*   Estimated Creatinine Clearance: 84.2 ml/min (by C-G formula based on Cr of 0.43). No results found for this basename: VANCOTROUGH, Leodis Binet, VANCORANDOM, GENTTROUGH, GENTPEAK, GENTRANDOM, TOBRATROUGH, TOBRAPEAK, TOBRARND, AMIKACINPEAK, AMIKACINTROU, AMIKACIN,  in the last 72 hours   Microbiology: Recent Results (from the past 720 hour(s))  CULTURE, BLOOD (SINGLE)     Status: None   Collection Time    02/13/13 10:30 PM      Result Value Range Status   Specimen Description BLOOD LEFT HAND   Final   Special Requests BOTTLES DRAWN AEROBIC AND ANAEROBIC 10CC   Final   Culture  Setup Time     Final   Value: 02/14/2013 05:54     Performed at [facility]   Culture     Final   Value: STAPHYLOCOCCUS AUREUS     Note: RIFAMPIN AND GENTAMICIN SHOULD NOT BE USED AS SINGLE DRUGS FOR TREATMENT OF STAPH INFECTIONS. This organism is presumed to be Clindamycin resistant based on detection of inducible Clindamycin resistance.     Note: Gram Stain Report Called to,Read Back By and Verified With: KAYLIN REID ON 02/14/2013 AT 11:48P BY Serafina Mitchell     Performed at [facility]   Report Status 02/16/2013 FINAL   Final   Organism ID, Bacteria STAPHYLOCOCCUS AUREUS   Final  URINE CULTURE     Status: None   Collection Time    02/13/13 10:46 PM      Result Value Range  Status   Specimen Description URINE, CLEAN CATCH   Final   Special Requests Normal   Final   Culture  Setup Time     Final   Value: 02/14/2013 01:11     Performed at [facility] Count     Final   Value: NO GROWTH     Performed at [facility]   Culture     Final   Value: NO GROWTH     Performed at [facility]   Report Status 02/15/2013 FINAL   Final  MRSA PCR SCREENING     Status: None   Collection Time    02/14/13  2:21 AM      Result Value Range Status   MRSA by PCR NEGATIVE  NEGATIVE Final   Comment:            The GeneXpert MRSA Assay (FDA     approved for NASAL specimens     only), is one component of a     comprehensive MRSA colonization     surveillance program. It is not     intended to diagnose MRSA     infection nor to guide or     monitor treatment for     MRSA infections.  CULTURE, BLOOD (ROUTINE X 2)     Status: None   Collection Time  02/15/13  3:23 PM      Result Value Range Status   Specimen Description BLOOD RIGHT FOREARM   Final   Special Requests BOTTLES DRAWN AEROBIC ONLY 10CC   Final   Culture  Setup Time     Final   Value: 02/16/2013 01:44     Performed at [facility]   Culture     Final   Value: NO GROWTH 5 DAYS     Performed at [facility]   Report Status 02/22/2013 FINAL   Final  CULTURE, BLOOD (ROUTINE X 2)     Status: None   Collection Time    02/15/13  3:30 PM      Result Value Range Status   Specimen Description BLOOD RIGHT HAND   Final   Special Requests BOTTLES DRAWN AEROBIC ONLY 3CC   Final   Culture  Setup Time     Final   Value: 02/16/2013 01:44     Performed at [facility]   Culture     Final   Value: NO GROWTH 5 DAYS     Performed at [facility]   Report Status 02/22/2013 FINAL   Final    Anti-infectives   Start     Dose/Rate Route Frequency Ordered Stop   02/23/13 0000  ceFAZolin (ANCEF) 2-3 GM-% SOLR     2 g 100 mL/hr over 30 Minutes Intravenous  Every 8 hours 02/23/13 1323     02/21/13 0000  ceFAZolin (ANCEF) 2-3 GM-% SOLR  Status:  Discontinued     2 g 100 mL/hr over 30 Minutes Intravenous Every 8 hours 02/21/13 0847 02/23/13    02/16/13 0630  vancomycin (VANCOCIN) 1,250 mg in sodium chloride 0.9 % 250 mL IVPB  Status:  Discontinued     1,250 mg 166.7 mL/hr over 90 Minutes Intravenous Every 8 hours 02/16/13 0015 02/16/13 1018   02/15/13 1600  ceFAZolin (ANCEF) IVPB 2 g/50 mL premix     2 g 100 mL/hr over 30 Minutes Intravenous 3 times per day 02/15/13 1442     02/14/13 0630  vancomycin (VANCOCIN) IVPB 1000 mg/200 mL premix  Status:  Discontinued     1,000 mg 200 mL/hr over 60 Minutes Intravenous Every 8 hours 02/13/13 2211 02/16/13 0015   02/13/13 2315  Ampicillin-Sulbactam (UNASYN) 3 g in sodium chloride 0.9 % 100 mL IVPB  Status:  Discontinued     3 g 100 mL/hr over 60 Minutes Intravenous Every 8 hours 02/13/13 2312 02/15/13 1418   02/13/13 2200  vancomycin (VANCOCIN) IVPB 1000 mg/200 mL premix     1,000 mg 200 mL/hr over 60 Minutes Intravenous  Once 02/13/13 2153 02/13/13 2348      Assessment: 62 y.o. F who continues on Ancef for MSSA bacteremia with superficial thrombophlebitis of R-thigh. TEE showed no vegetations, repeat BCx negative. ID consulted -- recommended total of 4 weeks of antibiotics (stop date of 11/19). Renal function stable -- dose remains appropriate.   Goal of Therapy:  Proper antibiotics for infection/cultures adjusted for renal/hepatic function   Plan:  1. Continue Ancef 2g IV every 8 hours 2. Will f/u plans for discharge -- pt to be followed up with home health  Georgina Pillion, PharmD, BCPS Clinical Pharmacist Pager: [phone] 02/23/2013 1:37 PM

## 2013-02-28 ENCOUNTER — Ambulatory Visit (INDEPENDENT_AMBULATORY_CARE_PROVIDER_SITE_OTHER): Payer: BC Managed Care – PPO | Admitting: Vascular Surgery

## 2013-02-28 ENCOUNTER — Encounter: Payer: Self-pay | Admitting: Vascular Surgery

## 2013-02-28 VITALS — BP 151/77 | HR 78 | Temp 98.4°F | Ht 65.0 in | Wt 197.4 lb

## 2013-02-28 DIAGNOSIS — I809 Phlebitis and thrombophlebitis of unspecified site: Secondary | ICD-10-CM

## 2013-02-28 NOTE — Progress Notes (Signed)
Patient name: Marie Stone MRN: [id number] DOB: 03/05/1951 Sex: female  REASON FOR VISIT: follow up of superficial thrombophlebitis of right thigh.  HPI: Marie Stone is a 62 y.o. female who had a laser ablation of the saphenous vein in the right leg and a vein center. She was admitted to the hospital with septic thrombophlebitis. She was treated with intravenous antibiotics. During that hospitalization I I&D'd the right thigh wound and this was being packed. She comes in for a routine wound check. She denies fever or chills. She has a PICC line for continued IV Ancef.  REVIEW OF SYSTEMS: Arly.Keller ] denotes positive finding; [  ] denotes negative finding  CARDIOVASCULAR:  [ ]  chest pain   [ ]  dyspnea on exertion    CONSTITUTIONAL:  [ ]  fever   [ ]  chills  PHYSICAL EXAM: Filed Vitals:   02/28/13 1004  BP: 151/77  Pulse: 78  Temp: 98.4 F (36.9 C)  TempSrc: Oral  Height: 5\' 5"  (1.651 m)  Weight: 197 lb 6.4 oz (89.54 kg)  SpO2: 100%   Body mass index is 32.85 kg/(m^2). GENERAL: The patient is a well-nourished female, in no acute distress. The vital signs are documented above. CARDIOVASCULAR: There is a regular rate and rhythm. PULMONARY: There is good air exchange bilaterally without wheezing or rales. The wound of the right thigh has improved somewhat. The one open area was packed with a moist 2 x 2. Versed area of drainage further distally however I could not connect to this area with a Q-tip. I have explained that if this gets larger this could potentially be packed also.  MEDICAL ISSUES:  Septic thrombophlebitis The wound on the medial right thigh is gradually improving. Her husband will continue to packs the wound daily. I'll plan on seeing her back in 3 weeks. She knows to call sooner if she has problems.   Tamara Monteith S [facility] [place] Beeper: [phone]

## 2013-02-28 NOTE — Assessment & Plan Note (Signed)
The wound on the medial right thigh is gradually improving. Her husband will continue to packs the wound daily. I'll plan on seeing her back in 3 weeks. She knows to call sooner if she has problems.

## 2013-03-01 ENCOUNTER — Encounter: Payer: Self-pay | Admitting: Internal Medicine

## 2013-03-01 ENCOUNTER — Ambulatory Visit (INDEPENDENT_AMBULATORY_CARE_PROVIDER_SITE_OTHER): Payer: BC Managed Care – PPO | Admitting: Internal Medicine

## 2013-03-01 VITALS — BP 153/78 | HR 80 | Temp 98.0°F | Ht 64.5 in | Wt 195.0 lb

## 2013-03-01 DIAGNOSIS — A419 Sepsis, unspecified organism: Secondary | ICD-10-CM

## 2013-03-01 DIAGNOSIS — A4101 Sepsis due to Methicillin susceptible Staphylococcus aureus: Secondary | ICD-10-CM

## 2013-03-02 NOTE — Assessment & Plan Note (Signed)
Doing well, tolerating therapy.  Will continue through NOvember 19th.  If wound still with drainage, may continue with oral therapy.

## 2013-03-02 NOTE — Progress Notes (Signed)
  Subjective:    Patient ID: Marie Stone, female    DOB: November 23, 1950, 62 y.o.   MRN: [id number]  HPI Marie Stone is a 62 y.o. female with diabetes, recent varicose vein surgery, vein stipping, who presented to the hospital with fever to 104, chills and altered mental status. Since her surgery she has had some leaking of the area. She was then brought in to ED and noted fever, hypotension. WBC of 16K initially and started on vancomycin and unasyn. [place] of leg with thrombus. She improved with empiric therapy and then grew MSSA in 1/1 blood culture.  TTE negative.  Here two weeks into projected 4 weeks of therapy.  No new issues, no diarrhea, no rash.  Picc working good.  No fever, no chills.      Review of Systems  Constitutional: Negative for fever, chills, appetite change and fatigue.  Respiratory: Negative for cough.   Cardiovascular: Negative for leg swelling.  Gastrointestinal: Negative for nausea, abdominal pain and diarrhea.  Endocrine: Negative for polyuria.  Musculoskeletal: Negative for myalgias.  Skin: Negative for rash.  Neurological: Negative for dizziness and light-headedness.  Psychiatric/Behavioral: Negative for dysphoric mood.       Objective:   Physical Exam  Constitutional: She appears well-developed and well-nourished. No distress.  Eyes: Right eye exhibits no discharge. Left eye exhibits no discharge. No scleral icterus.  Cardiovascular: Normal rate, regular rhythm and normal heart sounds.   No murmur heard. Pulmonary/Chest: Effort normal and breath sounds normal. No respiratory distress.  Lymphadenopathy:    She has no cervical adenopathy.  Neurological: She is alert.  Skin:  picc without erythema  Psychiatric: She has a normal mood and affect.          Assessment & Plan:

## 2013-03-07 ENCOUNTER — Ambulatory Visit: Payer: BC Managed Care – PPO | Admitting: Vascular Surgery

## 2013-03-08 ENCOUNTER — Telehealth: Payer: Self-pay | Admitting: *Deleted

## 2013-03-08 NOTE — Telephone Encounter (Signed)
Pt's husband saw a "hair/eyelash" underneath the PICC dressing near the catheter insertion site.  Pt and husband worried about additional infection.  Pt had already called the [facility] RN this morning after seeing the "hair/eyelash."  [facility] RN stated that she would coming early this afternoon to change the PICC dressing.  [facility] RN already scheduled for visit for wound care.  RN advised pt that she had done all the right things and that having the PICC dressing changed today was GREAT!   Pt verbalized understanding.  Pt has return appt with Dr. Luciana Axe, 03/14/13.  Pt plans to keep appt.  Anxious to have IV abx completed and PICC taken out.

## 2013-03-14 ENCOUNTER — Telehealth: Payer: Self-pay | Admitting: *Deleted

## 2013-03-14 ENCOUNTER — Encounter: Payer: Self-pay | Admitting: Internal Medicine

## 2013-03-14 ENCOUNTER — Ambulatory Visit (INDEPENDENT_AMBULATORY_CARE_PROVIDER_SITE_OTHER): Payer: BC Managed Care – PPO | Admitting: Internal Medicine

## 2013-03-14 VITALS — BP 138/82 | HR 105 | Temp 97.5°F | Wt 191.0 lb

## 2013-03-14 DIAGNOSIS — A4101 Sepsis due to Methicillin susceptible Staphylococcus aureus: Secondary | ICD-10-CM

## 2013-03-14 DIAGNOSIS — A419 Sepsis, unspecified organism: Secondary | ICD-10-CM

## 2013-03-14 NOTE — Assessment & Plan Note (Signed)
He has done well with 4 weeks of therapy and will have the PICC line taken out today. She'll return on a p.r.n. Basis. No further antibiotics needed since her wound is without significant issues. It is wrapped today and patient does not want to unwrap it

## 2013-03-14 NOTE — Progress Notes (Signed)
  Subjective:    Patient ID: Marie Stone, female    DOB: 27-Feb-1951, 62 y.o.   MRN: [id number]  HPI  Marie Stone is a 62 y.o. female with diabetes, recent varicose vein surgery, vein stipping, who presented to the hospital with fever to 104, chills and altered mental status. She was then brought in to ED and noted fever, hypotension. WBC of 16K initially and started on vancomycin and unasyn. [place] of leg with thrombus. She improved with empiric therapy and then grew MSSA in 1/1 blood culture.  TTE negative.  Here now after 4 weeks of therapy.  No new issues, no diarrhea, no rash.  Picc working good.  No fever, no chills.  Wound almost closed.     Review of Systems  Constitutional: Negative for fever, chills, appetite change and fatigue.  Respiratory: Negative for cough.   Cardiovascular: Negative for leg swelling.  Gastrointestinal: Negative for nausea, abdominal pain and diarrhea.  Endocrine: Negative for polyuria.  Musculoskeletal: Negative for myalgias.  Skin: Negative for rash.  Neurological: Negative for dizziness and light-headedness.  Psychiatric/Behavioral: Negative for dysphoric mood.       Objective:   Physical Exam  Constitutional: She appears well-developed and well-nourished. No distress.  Eyes: Right eye exhibits no discharge. Left eye exhibits no discharge. No scleral icterus.  Cardiovascular: Normal rate, regular rhythm and normal heart sounds.   No murmur heard. Pulmonary/Chest: Effort normal and breath sounds normal. No respiratory distress.  Lymphadenopathy:    She has no cervical adenopathy.  Neurological: She is alert.  Skin:  picc without erythema  Psychiatric: She has a normal mood and affect.          Assessment & Plan:

## 2013-03-14 NOTE — Progress Notes (Signed)
RN received verbal order to discontinue the patient's PICC line.  Patient identified with name and date of birth. PICC dressing removed, site unremarkable.    PICC line removed using sterile procedure @ 1000. PICC length equal to that noted in patient's hospital chart of 42 cm. Sterile petroleum gauze + sterile 4X4 applied to PICC site, pressure applied for 10 minutes and covered with Medipore tape as a pressure dressing. Patient tolerated procedure without complaints.  Patient instructed to limit use of arm for 1 hour. Patient instructed that the pressure dressing should remain in place for 24 hours. Patient verbalized understanding of these instructions.

## 2013-03-14 NOTE — Telephone Encounter (Signed)
[facility] notified that patient's picc line was pulled today here in the office. Today was stop date for antibiotics. Marie Stone

## 2013-03-27 ENCOUNTER — Encounter: Payer: Self-pay | Admitting: Vascular Surgery

## 2013-03-28 ENCOUNTER — Encounter: Payer: Self-pay | Admitting: Vascular Surgery

## 2013-03-28 ENCOUNTER — Ambulatory Visit (INDEPENDENT_AMBULATORY_CARE_PROVIDER_SITE_OTHER): Payer: BC Managed Care – PPO | Admitting: Vascular Surgery

## 2013-03-28 VITALS — BP 137/78 | HR 88 | Ht 65.0 in | Wt 193.3 lb

## 2013-03-28 DIAGNOSIS — I809 Phlebitis and thrombophlebitis of unspecified site: Secondary | ICD-10-CM

## 2013-03-28 NOTE — Progress Notes (Signed)
Patient name: Marie Stone MRN: [id number] DOB: 16-May-1950 Sex: female  REASON FOR VISIT: follow up of superficial thrombophlebitis of the right side.  HPI: Marie Stone is a 62 y.o. female who I last saw on 02/28/2013. She had undergone laser ablation of the saphenous vein of the right leg at a vein center. She was admitted with septic thrombophlebitis and was treated with intravenous antibiotics. She has been on IV Ancef through a PICC line. At the time of her last visit, the wound in the right thigh was improved somewhat. The open area was packed with a moist 2 x 2. She comes in for a three-week follow up visit. Since I saw her last the wound has improved significantly and her husband is no longer able to pack anything into the wound. In addition, she is no longer on intravenous Ancef. She has no specific complaints. He denies fever or chills.  REVIEW OF SYSTEMS: Arly.Keller ] denotes positive finding; [  ] denotes negative finding  CARDIOVASCULAR:  [ ]  chest pain   [ ]  dyspnea on exertion    CONSTITUTIONAL:  [ ]  fever   [ ]  chills  PHYSICAL EXAM: Filed Vitals:   03/28/13 0930  BP: 137/78  Pulse: 88  Height: 5\' 5"  (1.651 m)  Weight: 193 lb 4.8 oz (87.68 kg)  SpO2: 100%   Body mass index is 32.17 kg/(m^2). GENERAL: The patient is a well-nourished female, in no acute distress. The vital signs are documented above. CARDIOVASCULAR: There is a regular rate and rhythm  PULMONARY: There is good air exchange bilaterally without wheezing or rales. The area of phlebitis in her right thigh has now healed. 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  MEDICAL ISSUES: The patient is doing well status post her incision and drainage for septic thrombophlebitis of her right side. No further dressing changes are needed. She is no longer on antibiotics. We will see her back as needed. She currently is not interested in having anything done about the large truncal  varicosities in her lower right leg. Certainly given all but she has been through this seems reasonable.  Marie Stone S [facility] [place] Beeper: [phone]

## 2013-04-10 ENCOUNTER — Encounter: Payer: Self-pay | Admitting: Internal Medicine

## 2013-04-24 ENCOUNTER — Encounter: Payer: Self-pay | Admitting: Vascular Surgery

## 2013-04-25 ENCOUNTER — Ambulatory Visit: Payer: BC Managed Care – PPO | Admitting: Vascular Surgery

## 2013-05-02 ENCOUNTER — Ambulatory Visit: Payer: BC Managed Care – PPO | Admitting: Vascular Surgery

## 2013-05-02 ENCOUNTER — Encounter: Payer: Self-pay | Admitting: Vascular Surgery

## 2013-05-04 ENCOUNTER — Ambulatory Visit: Payer: BC Managed Care – PPO

## 2013-06-06 ENCOUNTER — Encounter: Payer: Self-pay | Admitting: Internal Medicine

## 2013-11-20 ENCOUNTER — Ambulatory Visit
Admission: RE | Admit: 2013-11-20 | Discharge: 2013-11-20 | Disposition: A | Discharge status: Home / Self Care | Payer: Self-pay | Source: Ambulatory Visit | Attending: Family Medicine | Admitting: Family Medicine

## 2013-11-20 ENCOUNTER — Encounter (INDEPENDENT_AMBULATORY_CARE_PROVIDER_SITE_OTHER): Payer: Self-pay

## 2013-11-20 DIAGNOSIS — Z1231 Encounter for screening mammogram for malignant neoplasm of breast: Secondary | ICD-10-CM

## 2013-11-21 ENCOUNTER — Other Ambulatory Visit: Payer: Self-pay | Admitting: Family Medicine

## 2013-11-21 DIAGNOSIS — R928 Other abnormal and inconclusive findings on diagnostic imaging of breast: Secondary | ICD-10-CM

## 2013-11-27 ENCOUNTER — Ambulatory Visit
Admission: RE | Admit: 2013-11-27 | Discharge: 2013-11-27 | Disposition: A | Discharge status: Home / Self Care | Payer: Self-pay | Source: Ambulatory Visit | Attending: Family Medicine | Admitting: Family Medicine

## 2013-11-27 ENCOUNTER — Other Ambulatory Visit: Payer: Self-pay | Admitting: Family Medicine

## 2013-11-27 ENCOUNTER — Ambulatory Visit
Admission: RE | Admit: 2013-11-27 | Discharge: 2013-11-27 | Disposition: A | Discharge status: Home / Self Care | Payer: BC Managed Care – PPO | Source: Ambulatory Visit | Attending: Family Medicine | Admitting: Family Medicine

## 2013-11-27 DIAGNOSIS — N6320 Unspecified lump in the left breast, unspecified quadrant: Secondary | ICD-10-CM

## 2013-11-27 DIAGNOSIS — R928 Other abnormal and inconclusive findings on diagnostic imaging of breast: Secondary | ICD-10-CM

## 2013-11-28 ENCOUNTER — Other Ambulatory Visit: Payer: Self-pay | Admitting: Family Medicine

## 2013-11-28 DIAGNOSIS — N63 Unspecified lump in unspecified breast: Secondary | ICD-10-CM

## 2013-12-24 ENCOUNTER — Other Ambulatory Visit: Payer: Self-pay | Admitting: Obstetrics and Gynecology

## 2013-12-24 DIAGNOSIS — N63 Unspecified lump in unspecified breast: Secondary | ICD-10-CM

## 2013-12-25 ENCOUNTER — Other Ambulatory Visit (HOSPITAL_COMMUNITY): Payer: Self-pay | Admitting: Diagnostic Radiology

## 2013-12-25 ENCOUNTER — Encounter (HOSPITAL_COMMUNITY): Payer: Self-pay

## 2013-12-25 ENCOUNTER — Ambulatory Visit
Admission: RE | Admit: 2013-12-25 | Discharge: 2013-12-25 | Disposition: A | Discharge status: Home / Self Care | Payer: No Typology Code available for payment source | Source: Ambulatory Visit | Attending: Family Medicine | Admitting: Family Medicine

## 2013-12-25 ENCOUNTER — Ambulatory Visit (HOSPITAL_COMMUNITY)
Admission: RE | Admit: 2013-12-25 | Discharge: 2013-12-25 | Disposition: A | Discharge status: Home / Self Care | Payer: No Typology Code available for payment source | Source: Ambulatory Visit | Attending: Obstetrics and Gynecology | Admitting: Obstetrics and Gynecology

## 2013-12-25 VITALS — BP 124/72 | Temp 98.5°F | Ht 64.0 in | Wt 203.6 lb

## 2013-12-25 DIAGNOSIS — Z01419 Encounter for gynecological examination (general) (routine) without abnormal findings: Secondary | ICD-10-CM

## 2013-12-25 DIAGNOSIS — N63 Unspecified lump in unspecified breast: Secondary | ICD-10-CM

## 2013-12-25 NOTE — Progress Notes (Signed)
Patient referred to [facility] by the [facility] due to recommending a left breast stereo biopsy. Screening mammogram completed 11/20/2013 and diagnostic mammogram completed 11/27/2013 at the [facility].  Pap Smear:  Pap smear completed today. Patients last Pap smear was 6 years ago and normal per patient. Per patient has no history of an abnormal Pap smear. No Pap smear results in EPIC. Physical exam: Breasts Breasts symmetrical. No skin abnormalities bilateral breasts. No nipple retraction bilateral breasts. No nipple discharge bilateral breasts. No lymphadenopathy. No lumps palpated bilateral breasts. No complaints of pain or tenderness on exam. Referred patient to the [facility] for left breast stereo biopsy per recommendation. Appointment scheduled for Tuesday, December 25, 2013 at 1500.           Pelvic/Bimanual   Ext Genitalia No lesions, no swelling and no discharge observed on external genitalia.         Vagina Vagina pink and normal texture. No lesions or discharge observed in vagina.          Cervix Cervix is present. Cervix pink and of normal texture. No discharge observed.     Uterus Uterus is present and palpable. Uterus in normal position and normal size.        Adnexae Bilateral ovaries present and unable to palpate. No tenderness on palpation.          Rectovaginal No rectal exam completed today since patient had no rectal complaints. No skin abnormalities observed on exam.

## 2013-12-25 NOTE — Patient Instructions (Signed)
Explained to Marie Stone that [facility] will cover Pap smears and co-testing every 5 years unless has a history of abnormal Pap smears. Referred patient to the [facility] for left breast stereo biopsy per recommendation. Appointment scheduled for Tuesday, December 25, 2013 at 1500. Patient aware of appointment and will be there. Let patient know will follow up with her within the next couple weeks with results of Pap smear by phone. Marie Stone verbalized understanding.

## 2013-12-26 LAB — CYTOLOGY - PAP

## 2013-12-28 ENCOUNTER — Telehealth (HOSPITAL_COMMUNITY): Payer: Self-pay | Admitting: *Deleted

## 2013-12-28 NOTE — Telephone Encounter (Signed)
Telephoned patient at home # and patient does not have voicemail set up

## 2014-01-11 ENCOUNTER — Telehealth (HOSPITAL_COMMUNITY): Payer: Self-pay | Admitting: *Deleted

## 2014-01-11 NOTE — Telephone Encounter (Signed)
Telephoned patient at home # and left message  

## 2014-02-22 ENCOUNTER — Telehealth (HOSPITAL_COMMUNITY): Payer: Self-pay | Admitting: *Deleted

## 2014-02-22 NOTE — Telephone Encounter (Signed)
Telephoned patient at home # and discussed negative pap smear results. HPV testing was also negative. Next pap smear due in 5 years. Patient voiced understanding.

## 2014-02-25 ENCOUNTER — Encounter (HOSPITAL_COMMUNITY): Payer: Self-pay

## 2014-07-25 DIAGNOSIS — E1142 Type 2 diabetes mellitus with diabetic polyneuropathy: Secondary | ICD-10-CM | POA: Insufficient documentation

## 2014-07-25 DIAGNOSIS — Z720 Tobacco use: Secondary | ICD-10-CM | POA: Insufficient documentation

## 2014-07-25 DIAGNOSIS — E785 Hyperlipidemia, unspecified: Secondary | ICD-10-CM | POA: Insufficient documentation

## 2014-09-10 DIAGNOSIS — R030 Elevated blood-pressure reading, without diagnosis of hypertension: Secondary | ICD-10-CM | POA: Insufficient documentation

## 2014-10-08 ENCOUNTER — Other Ambulatory Visit: Payer: Self-pay | Admitting: Nurse Practitioner

## 2014-10-08 DIAGNOSIS — R921 Mammographic calcification found on diagnostic imaging of breast: Secondary | ICD-10-CM

## 2014-10-27 ENCOUNTER — Ambulatory Visit (INDEPENDENT_AMBULATORY_CARE_PROVIDER_SITE_OTHER): Payer: BLUE CROSS/BLUE SHIELD

## 2014-10-27 ENCOUNTER — Ambulatory Visit (INDEPENDENT_AMBULATORY_CARE_PROVIDER_SITE_OTHER): Payer: BLUE CROSS/BLUE SHIELD | Admitting: Internal Medicine

## 2014-10-27 VITALS — BP 134/68 | HR 86 | Temp 98.2°F | Resp 18 | Ht 64.0 in | Wt 199.0 lb

## 2014-10-27 DIAGNOSIS — M25511 Pain in right shoulder: Secondary | ICD-10-CM

## 2014-10-27 DIAGNOSIS — M5010 Cervical disc disorder with radiculopathy, unspecified cervical region: Secondary | ICD-10-CM

## 2014-10-27 DIAGNOSIS — G8929 Other chronic pain: Secondary | ICD-10-CM

## 2014-10-27 DIAGNOSIS — M542 Cervicalgia: Secondary | ICD-10-CM | POA: Diagnosis not present

## 2014-10-27 MED ORDER — MELOXICAM 15 MG PO TABS: 15.0000 mg | ORAL_TABLET | Freq: Every day | ORAL | Status: DC

## 2014-10-27 MED ORDER — CYCLOBENZAPRINE HCL 10 MG PO TABS: 10.0000 mg | ORAL_TABLET | Freq: Every day | ORAL | Status: DC

## 2014-10-27 MED ORDER — TRAMADOL HCL 50 MG PO TABS: 50.0000 mg | ORAL_TABLET | Freq: Four times a day (QID) | ORAL | Status: DC | PRN

## 2014-10-27 NOTE — Progress Notes (Addendum)
Subjective:  This chart was scribed for Marie Sia, MD by [facility], medical scribe at [facility].The patient was seen in exam room 08 and the patient's care was started at 1:08 PM.   Patient ID: Marie Stone, female    DOB: 10-24-1950, 64 y.o.   MRN: [id number] Chief Complaint  Patient presents with  . Shoulder Pain    right, x 3 days, no known injury   HPI  HPI Comments: Marie Stone is a 64 y.o. female who presents to [facility] complaining of right shoulder pain and right sided neck pain for three days. Today she was lifting her granddaughters stroller and she aggravated her shoulder. She has been sleeping on recliner. Pt has never had x-rays of her neck. Pt has been using a heating pad for relief. No numbness or weakness in her hands  She has a history of intermittent shoulder pain stretching over several months and also intermittent neck restricted motion for several months. She has not developed peripheral neurological symptoms   Diabetes uncontrolled recently Patient Active Problem List   Diagnosis Date Noted  . Phlebitis 02/28/2013  . Nausea and vomiting 02/22/2013  . Hypokalemia 02/21/2013  . Septic thrombophlebitis 02/16/2013  . Staphylococcus aureus sepsis 02/15/2013  . Thrombocytopenia, unspecified 02/15/2013  . Sepsis associated hypotension 02/14/2013  . Diabetes mellitus 02/14/2013  . Hypotension (arterial) 02/14/2013     Prior to Admission medications   Medication Sig Start Date End Date Taking? Authorizing Provider  atorvastatin (LIPITOR) 10 MG tablet Take 10 mg by mouth every evening.   Yes pcp Malen Gauze   gabapentin (NEURONTIN) 100 MG capsule Take 100 mg by mouth 3 (three) times daily.   Yes Historical Provider, MD  sitaGLIPtin (JANUVIA) 100 MG tablet Take 100 mg by mouth daily.   Yes Historical Provider, MD  acetaminophen (TYLENOL) 325 MG tablet Take 2 tablets (650 mg total) by mouth every 6 (six) hours  as needed. Patient not taking: Reported on 10/27/2014 02/23/13   Christiane Ha, MD  Canagliflozin (INVOKANA) 100 MG TABS Take 200 mg by mouth daily.    Historical Provider, MD  ceFAZolin (ANCEF) 2-3 GM-% SOLR Inject 50 mLs (2 g total) into the vein every 8 (eight) hours. Through 03/14/13 Patient not taking: Reported on 10/27/2014 02/23/13   Christiane Ha, MD  traMADol (ULTRAM) 50 MG tablet Take 1 tablet (50 mg total) by mouth every 6 (six) hours as needed. Patient not taking: Reported on 10/27/2014 02/23/13   Christiane Ha, MD   No Known Allergies  Review of Systems  Constitutional: Negative for fever and unexpected weight change.  HENT: Negative for trouble swallowing.   Eyes: Negative for visual disturbance.  Musculoskeletal: Positive for arthralgias, neck pain and neck stiffness.  Skin: Negative for rash.  Neurological: Negative for weakness and numbness.      Objective:  BP 134/68 mmHg  Pulse 86  Temp(Src) 98.2 F (36.8 C)  Resp 18  Ht  (1.626 m)  Wt 199 lb (90.266 kg)  BMI 34.14 kg/m2  SpO2 98% Physical Exam  Constitutional: She is oriented to person, place, and time. She appears well-developed and well-nourished. No distress.  HENT:  Head: Normocephalic and atraumatic.  Eyes: Pupils are equal, round, and reactive to light.  Cardiovascular: Normal rate.   Pulmonary/Chest: Effort normal. No respiratory distress.  Musculoskeletal: Normal range of motion.  The right shoulder is mildly tender to palpation over the deltoid and  the right scapular border and right trapezius. She is also exquisitely tender over the right posterior cervical muscles. The neck has a decreased extension and rotation to the right due to pain. Shoulder has good range of motion including full abduction and adduction and external rotation No sensory or motor losses in the right hand  Neurological: She is alert and oriented to person, place, and time.  Skin: Skin is warm and dry.    Psychiatric: She has a normal mood and affect. Her behavior is normal.  Nursing note and vitals reviewed. BP 134/68 mmHg  Pulse 86  Temp(Src) 98.2 F (36.8 C)  Resp 18  Ht 5\' 4"  (1.626 m)  Wt 199 lb (90.266 kg)  BMI 34.14 kg/m2  SpO2 98% [facility] reading (PRIMARY) by  Dr. Merla Stone= there is narrowing (4-5 intravertebral)with multiple areas of degenerative change      Assessment & Plan:  Pain in joint, shoulder region, right - Plan: DG Cervical Spine 2 or 3 views, Ambulatory referral to Orthopedic Surgery  Neck pain of over 3 months duration - Plan: DG Cervical Spine 2 or 3 views  Cervical disc disorder with radiculopathy of cervical region - Plan: Ambulatory referral to Orthopedic Surgery  Meds ordered this encounter  Medications  . meloxicam (MOBIC) 15 MG tablet    Sig: Take 1 tablet (15 mg total) by mouth daily.    Dispense:  30 tablet    Refill:  0  . traMADol (ULTRAM) 50 MG tablet    Sig: Take 1-2 tablets (50-100 mg total) by mouth every 6 (six) hours as needed.    Dispense:  30 tablet    Refill:  1  . cyclobenzaprine (FLEXERIL) 10 MG tablet    Sig: Take 1 tablet (10 mg total) by mouth at bedtime.    Dispense:  30 tablet    Refill:  0   Cervical collar Heat Referral to orthopedics--Dr Jerl Santosalldorf takes care of several members of her family Prednisone is not an option due to her diabetes   I have completed the patient encounter in its entirety as documented by the scribe, with editing by me where necessary. Marie Lehnen P. Merla Stone, M.D.

## 2014-11-20 ENCOUNTER — Other Ambulatory Visit: Payer: Self-pay | Admitting: Internal Medicine

## 2014-11-25 ENCOUNTER — Ambulatory Visit
Admission: RE | Admit: 2014-11-25 | Discharge: 2014-11-25 | Disposition: A | Discharge status: Home / Self Care | Payer: No Typology Code available for payment source | Source: Ambulatory Visit | Attending: Nurse Practitioner | Admitting: Nurse Practitioner

## 2014-11-25 ENCOUNTER — Other Ambulatory Visit: Payer: Self-pay | Admitting: Internal Medicine

## 2014-11-25 ENCOUNTER — Other Ambulatory Visit: Payer: Self-pay | Admitting: Nurse Practitioner

## 2014-11-25 DIAGNOSIS — N63 Unspecified lump in unspecified breast: Secondary | ICD-10-CM

## 2014-11-25 DIAGNOSIS — R921 Mammographic calcification found on diagnostic imaging of breast: Secondary | ICD-10-CM

## 2014-12-05 ENCOUNTER — Other Ambulatory Visit: Payer: Self-pay | Admitting: Internal Medicine

## 2014-12-06 NOTE — Telephone Encounter (Signed)
reffred to Dr Jerl Santos Meds ordered this encounter  Medications  . traMADol (ULTRAM) 50 MG tablet    Sig: TAKE 1 TO 2 TABLETS BY MOUTH EVERY 6 HOURS AS NEEDED    Dispense:  30 tablet    Refill:  1    Not to exceed 4 additional fills before 04/25/2015

## 2014-12-06 NOTE — Telephone Encounter (Signed)
Faxed

## 2014-12-22 ENCOUNTER — Other Ambulatory Visit: Payer: Self-pay | Admitting: Physician Assistant

## 2014-12-22 ENCOUNTER — Other Ambulatory Visit: Payer: Self-pay | Admitting: Internal Medicine

## 2014-12-30 ENCOUNTER — Other Ambulatory Visit: Payer: Self-pay | Admitting: Internal Medicine

## 2014-12-30 ENCOUNTER — Other Ambulatory Visit: Payer: Self-pay | Admitting: Physician Assistant

## 2015-07-07 ENCOUNTER — Other Ambulatory Visit: Payer: Self-pay | Admitting: Gastroenterology

## 2016-02-23 ENCOUNTER — Ambulatory Visit: Payer: Self-pay | Admitting: Endocrinology

## 2016-02-25 ENCOUNTER — Ambulatory Visit: Payer: Self-pay | Admitting: *Deleted

## 2016-07-19 ENCOUNTER — Encounter: Payer: Self-pay | Admitting: Endocrinology

## 2016-07-19 ENCOUNTER — Ambulatory Visit (INDEPENDENT_AMBULATORY_CARE_PROVIDER_SITE_OTHER): Payer: Medicare HMO | Admitting: Endocrinology

## 2016-07-19 VITALS — BP 126/64 | HR 84 | Ht 64.0 in | Wt 197.0 lb

## 2016-07-19 DIAGNOSIS — E785 Hyperlipidemia, unspecified: Secondary | ICD-10-CM | POA: Diagnosis not present

## 2016-07-19 DIAGNOSIS — E1142 Type 2 diabetes mellitus with diabetic polyneuropathy: Secondary | ICD-10-CM

## 2016-07-19 DIAGNOSIS — G2581 Restless legs syndrome: Secondary | ICD-10-CM

## 2016-07-19 MED ORDER — INSULIN DEGLUDEC 100 UNIT/ML ~~LOC~~ SOPN: 20.0000 [IU] | PEN_INJECTOR | Freq: Every day | SUBCUTANEOUS | 11 refills | Status: DC

## 2016-07-19 MED ORDER — GLUCOSE BLOOD VI STRP: 1.0000 | ORAL_STRIP | Freq: Two times a day (BID) | 3 refills | Status: AC

## 2016-07-19 NOTE — Patient Instructions (Addendum)
good diet and exercise significantly improve the control of your diabetes.  please let me know if you wish to be referred to a dietician.  high blood sugar is very risky to your health.  you should see an eye doctor and dentist every year.  It is very important to get all recommended vaccinations.  Controlling your blood pressure and cholesterol drastically reduces the damage diabetes does to your body.  Those who smoke should quit.  Please discuss these with your doctor.  check your blood sugar twice a day.  vary the time of day when you check, between before the 3 meals, and at bedtime.  also check if you have symptoms of your blood sugar being too high or too low.  please keep a record of the readings and bring it to your next appointment here (or you can bring the meter itself).  You can write it on any piece of paper.  please call us sooner if your blood sugar goes below 70, or if you have a lot of readings over 200. I have sent a prescription to your pharmacy, to start "tresiba" insulin, 20 units daily. Please call us in 2-3 days, to tell us how the blood sugar is doing.   Please continue the same diabetes pills for now.   Please come back for a follow-up appointment in 1 month.

## 2016-07-19 NOTE — Progress Notes (Signed)
Subjective:    Patient ID: Marie Stone, female    DOB: 01-12-1951, 66 y.o.   MRN: [id number]  HPI pt is referred by Dr Biagio Borg, for diabetes.  Pt states DM was dx'ed in 2014; she has mild neuropathy of the lower extremities; she also has associated nephropathy; she was recently rx'ed insulin, but she declined; pt says her diet is poor and exercise is limited by health problems; she has never had GDM, pancreatitis, pancreatic surgery, severe hypoglycemia, or DKA.   Past Medical History:  Diagnosis Date  . Diabetes mellitus without complication [facility])     Past Surgical History:  Procedure Laterality Date  . LEG SURGERY    . TUBAL LIGATION      Social History   Social History  . Marital status: Married    Spouse name: N/A  . Number of children: N/A  . Years of education: N/A   Occupational History  . Not on file.   Social History Main Topics  . Smoking status: Current Every Day Smoker    Packs/day: 0.75    Types: Cigarettes  . Smokeless tobacco: Never Used  . Alcohol use No  . Drug use: No  . Sexual activity: Not Currently    Birth control/ protection: Surgical   Other Topics Concern  . Not on file   Social History Narrative  . No narrative on file    Current Outpatient Prescriptions on File Prior to Visit  Medication Sig Dispense Refill  . atorvastatin (LIPITOR) 10 MG tablet Take 10 mg by mouth every evening.    . gabapentin (NEURONTIN) 100 MG capsule Take 100 mg by mouth. 1-3 tab tid    . meloxicam (MOBIC) 15 MG tablet TAKE 1 TABLET (15 MG TOTAL) BY MOUTH DAILY.. "NO MORE REFILLS WITHOUT OV" (Patient taking differently: TAKE 1 TABLET (15 MG TOTAL) BY MOUTH DAILY.Marland Kitchen PRN) 30 tablet 0  . sitaGLIPtin (JANUVIA) 100 MG tablet Take 100 mg by mouth daily.     No current facility-administered medications on file prior to visit.     Allergies  Allergen Reactions  . Metformin And Related Diarrhea    Family History  Problem Relation Age of Onset  . Diabetes  Mother   . Breast cancer Maternal Aunt   . Breast cancer Maternal Grandmother     BP 126/64   Pulse 84   Ht 5\' 4"  (1.626 m)   Wt 197 lb (89.4 kg)   SpO2 96%   BMI 33.81 kg/m    Review of Systems denies weight loss, blurry vision, headache, chest pain, sob, n/v, urinary frequency, muscle cramps, excessive diaphoresis, depression, cold intolerance, rhinorrhea, and easy bruising.      Objective:   Physical Exam VS: see vs page GEN: no distress HEAD: head: no deformity eyes: no periorbital swelling, no proptosis external nose and ears are normal.  mouth: no lesion seen NECK: supple, thyroid is not enlarged.   CHEST WALL: no deformity LUNGS: clear to auscultation CV: reg rate and rhythm, no murmur ABD: abdomen is soft, nontender.  no hepatosplenomegaly.  not distended.  no hernia MUSCULOSKELETAL: muscle bulk and strength are grossly normal.  no obvious joint swelling.  gait is normal and steady EXTEMITIES: no deformity.  no ulcer on the feet.  feet are of normal color and temp.  Trace right leg edema, and bilat vv's (R>L).  There is bilateral onychomycosis of the toenails, and heavy calluses.  PULSES: dorsalis pedis intact bilat.  no carotid bruit.  NEURO:  cn 2-12 grossly intact.   readily moves all 4's.  sensation is intact to touch on the feet.  SKIN:  Normal texture and temperature.  No rash or suspicious lesion is visible.  Few ecchymoses of the forearms.  Patchy hyperpigmentation of the feet (R>L).   NODES:  None palpable at the neck.   PSYCH: alert, well-oriented.  Does not appear anxious nor depressed.    a1c=11.5%  I have reviewed outside records, and summarized: Pt was noted to have severely elevated a1c, and referred here.  She was advised to take insulin, and was instructed in the use of an insulin pen.     Assessment & Plan:  Insulin-requiring type 2 DM, with polyneuropathy: severe exacerbation.  Obesity, new to me: ref dietician.  Forgetfulness: she is not a  candidate for multiple daily injections.  Nephropathy: Please continue the same zestril.    Patient is advised the following: Patient Instructions  good diet and exercise significantly improve the control of your diabetes.  please let me know if you wish to be referred to a dietician.  high blood sugar is very risky to your health.  you should see an eye doctor and dentist every year.  It is very important to get all recommended vaccinations.  Controlling your blood pressure and cholesterol drastically reduces the damage diabetes does to your body.  Those who smoke should quit.  Please discuss these with your doctor.  check your blood sugar twice a day.  vary the time of day when you check, between before the 3 meals, and at bedtime.  also check if you have symptoms of your blood sugar being too high or too low.  please keep a record of the readings and bring it to your next appointment here (or you can bring the meter itself).  You can write it on any piece of paper.  please call us sooner if your blood sugar goes below 70, or if you have a lot of readings over 200. I have sent a prescription to your pharmacy, to start "tresiba" insulin, 20 units daily. Please call us in 2-3 days, to tell us how the blood sugar is doing.   Please continue the same diabetes pills for now.   Please come back for a follow-up appointment in 1 month.

## 2016-07-21 ENCOUNTER — Telehealth: Payer: Self-pay | Admitting: Endocrinology

## 2016-07-21 ENCOUNTER — Other Ambulatory Visit: Payer: Self-pay

## 2016-07-21 DIAGNOSIS — R69 Illness, unspecified: Secondary | ICD-10-CM | POA: Diagnosis not present

## 2016-07-21 MED ORDER — BLOOD GLUCOSE TEST VI STRP: ORAL_STRIP | 2 refills | Status: AC

## 2016-07-21 NOTE — Telephone Encounter (Signed)
Pt is confused a bit because both tresiba and toujeo were discussed   Pt also [facility]

## 2016-07-22 DIAGNOSIS — R69 Illness, unspecified: Secondary | ICD-10-CM | POA: Diagnosis not present

## 2016-07-22 MED ORDER — LANCETS MISC: 2 refills | Status: AC

## 2016-07-22 NOTE — Telephone Encounter (Signed)
I contacted the patient and advised Dr. Everardo AllEllison at this time is starting the [place]. Patient voiced understanding and was advised we have submitted her Lanc[facility] to [facility] as well.

## 2016-07-30 ENCOUNTER — Encounter (HOSPITAL_COMMUNITY): Payer: Self-pay | Admitting: *Deleted

## 2016-08-19 ENCOUNTER — Ambulatory Visit: Payer: BLUE CROSS/BLUE SHIELD | Admitting: Dietician

## 2016-08-19 ENCOUNTER — Ambulatory Visit: Payer: Medicare HMO | Admitting: Endocrinology

## 2016-11-18 DIAGNOSIS — R69 Illness, unspecified: Secondary | ICD-10-CM | POA: Diagnosis not present

## 2017-02-15 DIAGNOSIS — E119 Type 2 diabetes mellitus without complications: Secondary | ICD-10-CM | POA: Diagnosis not present

## 2017-03-04 DIAGNOSIS — R69 Illness, unspecified: Secondary | ICD-10-CM | POA: Diagnosis not present

## 2017-03-04 DIAGNOSIS — R809 Proteinuria, unspecified: Secondary | ICD-10-CM | POA: Diagnosis not present

## 2017-03-04 DIAGNOSIS — F17210 Nicotine dependence, cigarettes, uncomplicated: Secondary | ICD-10-CM | POA: Insufficient documentation

## 2017-03-04 DIAGNOSIS — E1129 Type 2 diabetes mellitus with other diabetic kidney complication: Secondary | ICD-10-CM | POA: Diagnosis not present

## 2017-03-04 DIAGNOSIS — E1142 Type 2 diabetes mellitus with diabetic polyneuropathy: Secondary | ICD-10-CM | POA: Diagnosis not present

## 2017-03-04 DIAGNOSIS — E785 Hyperlipidemia, unspecified: Secondary | ICD-10-CM | POA: Diagnosis not present

## 2018-06-02 ENCOUNTER — Ambulatory Visit: Payer: BLUE CROSS/BLUE SHIELD | Admitting: Podiatry

## 2018-06-15 ENCOUNTER — Other Ambulatory Visit: Payer: Self-pay

## 2018-06-15 ENCOUNTER — Ambulatory Visit: Payer: Medicare Other | Admitting: Podiatry

## 2018-06-15 ENCOUNTER — Encounter: Payer: Self-pay | Admitting: Podiatry

## 2018-06-15 VITALS — BP 106/60

## 2018-06-15 DIAGNOSIS — B351 Tinea unguium: Secondary | ICD-10-CM | POA: Diagnosis not present

## 2018-06-15 DIAGNOSIS — M79674 Pain in right toe(s): Secondary | ICD-10-CM | POA: Diagnosis not present

## 2018-06-15 DIAGNOSIS — M79675 Pain in left toe(s): Secondary | ICD-10-CM | POA: Diagnosis not present

## 2018-06-15 DIAGNOSIS — E1142 Type 2 diabetes mellitus with diabetic polyneuropathy: Secondary | ICD-10-CM | POA: Diagnosis not present

## 2018-06-15 DIAGNOSIS — L84 Corns and callosities: Secondary | ICD-10-CM

## 2018-06-15 NOTE — Patient Instructions (Signed)

## 2018-06-23 ENCOUNTER — Encounter: Payer: Self-pay | Admitting: Podiatry
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
## 2018-07-06 ENCOUNTER — Telehealth: Payer: Self-pay | Admitting: Podiatry

## 2018-07-06 NOTE — Telephone Encounter (Signed)
pts husband came in today to pick up his diabetic shoes and said his wife was in last month and has not heard anything about her diabetic shoes. I looked in the note and you did not mention pt needing diabetic shoes. Is it ok for me to get her scheduled to start the process? If so what are the dx that qualify pt.

## 2018-07-14 ENCOUNTER — Ambulatory Visit: Payer: Medicare Other | Admitting: Orthotics

## 2018-07-19 ENCOUNTER — Ambulatory Visit: Payer: Medicare Other | Admitting: Orthotics

## 2018-08-21 ENCOUNTER — Ambulatory Visit: Payer: Medicare Other | Admitting: Orthotics

## 2018-09-14 ENCOUNTER — Ambulatory Visit: Payer: Medicare Other | Admitting: Orthotics

## 2018-09-14 ENCOUNTER — Ambulatory Visit: Payer: Medicare Other | Admitting: Podiatry

## 2018-10-02 ENCOUNTER — Ambulatory Visit: Payer: Medicare Other | Admitting: Orthotics

## 2018-10-02 ENCOUNTER — Ambulatory Visit: Payer: Medicare Other | Admitting: Podiatry

## 2019-07-19 ENCOUNTER — Ambulatory Visit: Payer: Medicare Other | Attending: Internal Medicine

## 2019-07-19 DIAGNOSIS — Z23 Encounter for immunization: Secondary | ICD-10-CM

## 2019-07-19 NOTE — Progress Notes (Signed)
   Covid-19 Vaccination Clinic  Name:  Marie Stone    MRN: [id number] DOB: 21-Apr-1951  07/19/2019  [name] was observed post Covid-19 immunization for 15 minutes without incident. She was provided with Vaccine Information Sheet and instruction to access the V-Safe system.   [name] was instructed to call 911 with any severe reactions post vaccine: Marland Kitchen Difficulty breathing  . Swelling of face and throat  . A fast heartbeat  . A bad rash all over body  . Dizziness and weakness   Immunizations Administered    Name Date Dose VIS Date Route   Pfizer COVID-19 Vaccine 07/19/2019 10:09 AM 0.3 mL 04/06/2019 Intramuscular   Manufacturer: [employer], [employer]   Lot: [id number]   NDC: [address]-5

## 2019-08-13 ENCOUNTER — Ambulatory Visit: Payer: Medicare Other | Attending: Internal Medicine

## 2019-08-13 DIAGNOSIS — Z23 Encounter for immunization: Secondary | ICD-10-CM

## 2019-08-13 NOTE — Progress Notes (Signed)
   Covid-19 Vaccination Clinic  Name:  Marie Stone    MRN: [id number] DOB: 1950-11-12  08/13/2019  Ms. Lamere was observed post Covid-19 immunization for 15 minutes without incident. She was provided with Vaccine Information Sheet and instruction to access the V-Safe system.   Ms. Dimitri was instructed to call 911 with any severe reactions post vaccine: Marland Kitchen Difficulty breathing  . Swelling of face and throat  . A fast heartbeat  . A bad rash all over body  . Dizziness and weakness   Immunizations Administered    Name Date Dose VIS Date Route   Pfizer COVID-19 Vaccine 08/13/2019 10:04 AM 0.3 mL 06/20/2018 Intramuscular   Manufacturer: [employer], [employer]   Lot: [id number]   NDC: [address]-6

## 2019-10-01 DIAGNOSIS — I83891 Varicose veins of right lower extremities with other complications: Secondary | ICD-10-CM | POA: Insufficient documentation

## 2019-10-01 DIAGNOSIS — E1165 Type 2 diabetes mellitus with hyperglycemia: Secondary | ICD-10-CM | POA: Insufficient documentation

## 2020-04-01 DIAGNOSIS — E6609 Other obesity due to excess calories: Secondary | ICD-10-CM | POA: Insufficient documentation

## 2020-04-16 ENCOUNTER — Ambulatory Visit: Payer: Medicare Other | Admitting: Podiatry

## 2020-04-16 ENCOUNTER — Ambulatory Visit (INDEPENDENT_AMBULATORY_CARE_PROVIDER_SITE_OTHER): Payer: Medicare Other

## 2020-04-16 ENCOUNTER — Other Ambulatory Visit: Payer: Self-pay

## 2020-04-16 DIAGNOSIS — M19072 Primary osteoarthritis, left ankle and foot: Secondary | ICD-10-CM

## 2020-04-16 DIAGNOSIS — M659 Synovitis and tenosynovitis, unspecified: Secondary | ICD-10-CM

## 2020-04-16 DIAGNOSIS — M722 Plantar fascial fibromatosis: Secondary | ICD-10-CM

## 2020-04-16 MED ORDER — BETAMETHASONE SOD PHOS & ACET 6 (3-3) MG/ML IJ SUSP
3.0000 mg | Freq: Once | INTRAMUSCULAR | Status: AC
  Administered 2020-04-16: 3 mg via INTRAMUSCULAR

## 2020-04-16 NOTE — Progress Notes (Signed)
   Subjective:  69 y.o. female presenting today for new complaint regarding left ankle pain is been going on for approximately 1 year now.  Patient states that she experiences pain and tenderness to the left ankle.  On occasion it can shoot down to the toes.  Aggravated by activity.  She cannot recall an incident or injury that would have aggravated her left ankle.  Gradual onset.  She has not done anything for treatment.   Past Medical History:  Diagnosis Date  . Diabetes mellitus without complication (HCC)      Objective / Physical Exam:  General:  The patient is alert and oriented x3 in no acute distress. Dermatology:  Skin is warm, dry and supple bilateral lower extremities. Negative for open lesions or macerations. Vascular:  Palpable pedal pulses bilaterally. No edema or erythema noted. Capillary refill within normal limits. Neurological:  Epicritic and protective threshold grossly intact bilaterally.  Musculoskeletal Exam:  Pain on palpation to the anterolateral aspect of the patient's left ankle. Mild edema noted. Range of motion within normal limits to all pedal and ankle joints bilateral. Muscle strength 5/5 in all groups bilateral.   Radiographic Exam:  Normal osseous mineralization.  There are some degenerative changes noted diffusely throughout the joints and pedal structures of the foot given the patient's age  Assessment: 1.  DJD/capsulitis left ankle  Plan of Care:  1. Patient was evaluated. X-Rays reviewed.  2. Injection of 0.5 mL Celestone Soluspan injected in the patient's left ankle. 3.  Recommend good supportive shoes with arch supports 4.  Return to clinic as needed   Felecia Shelling, DPM [facility]  Dr. Felecia Shelling, DPM    [address] [place], [place] [address]                Office [phone]  Fax [phone]

## 2020-11-10 ENCOUNTER — Other Ambulatory Visit: Payer: Self-pay

## 2020-11-10 ENCOUNTER — Ambulatory Visit: Payer: Medicare Other | Admitting: Podiatry

## 2020-11-10 DIAGNOSIS — M7672 Peroneal tendinitis, left leg: Secondary | ICD-10-CM

## 2020-11-10 DIAGNOSIS — M659 Synovitis and tenosynovitis, unspecified: Secondary | ICD-10-CM

## 2020-11-10 MED ORDER — BETAMETHASONE SOD PHOS & ACET 6 (3-3) MG/ML IJ SUSP
3.0000 mg | Freq: Once | INTRAMUSCULAR | Status: AC
  Administered 2020-11-10: 3 mg via INTRA_ARTICULAR

## 2020-11-10 NOTE — Progress Notes (Signed)
   Subjective:  70 y.o. female presenting today for follow-up evaluation of left ankle pain is been going on for several years now.  Patient states that the last injection she received self helped significantly for several months.  Over the past 3 weeks she has had an increase in pain.  She denies a history of injury.  Patient also has a new complaint regarding the lateral aspect of the left foot.  She has been experiencing pain over the last few weeks to this area.  Aggravated by shoes and ambulation.  She currently has not done anything for treatment.  Again, she denies a history of injury to the area   Past Medical History:  Diagnosis Date   Diabetes mellitus without complication (HCC)      Objective / Physical Exam:  General:  The patient is alert and oriented x3 in no acute distress. Dermatology:  Skin is warm, dry and supple bilateral lower extremities. Negative for open lesions or macerations. Vascular:  Palpable pedal pulses bilaterally. No edema or erythema noted. Capillary refill within normal limits. Neurological:  Epicritic and protective threshold grossly intact bilaterally.  Musculoskeletal Exam:  Pain on palpation to the anterolateral aspect of the patient's left ankle as well as along the peroneal tendon sheath as it inserts onto the fifth metatarsal tubercle. Mild edema noted. Range of motion within normal limits to all pedal and ankle joints bilateral. Muscle strength 5/5 in all groups bilateral.   Radiographic Exam:  Normal osseous mineralization.  There are some degenerative changes noted diffusely throughout the joints and pedal structures of the foot given the patient's age  Assessment: 1.  DJD/capsulitis left ankle 2.  Insertional peroneal tendinitis left  Plan of Care:  1. Patient was evaluated. X-Rays reviewed.  2. Injection of 0.5 mL Celestone Soluspan injected in the patient's left ankle and the peroneal tendon sheath left as it inserts onto the fifth  metatarsal tubercle. 3.  Recommend good supportive shoes with arch supports 4.  Return to clinic as needed   Felecia Shelling, DPM [facility]  Dr. Felecia Shelling, DPM    [address] [place], [place] [address]                Office [phone]  Fax [phone]

## 2021-02-04 ENCOUNTER — Other Ambulatory Visit: Payer: Self-pay | Admitting: Podiatry

## 2021-02-04 ENCOUNTER — Ambulatory Visit (INDEPENDENT_AMBULATORY_CARE_PROVIDER_SITE_OTHER): Payer: Medicare Other

## 2021-02-04 ENCOUNTER — Other Ambulatory Visit: Payer: Self-pay

## 2021-02-04 ENCOUNTER — Ambulatory Visit: Payer: Medicare Other | Admitting: Podiatry

## 2021-02-04 DIAGNOSIS — E08621 Diabetes mellitus due to underlying condition with foot ulcer: Secondary | ICD-10-CM

## 2021-02-04 DIAGNOSIS — L97512 Non-pressure chronic ulcer of other part of right foot with fat layer exposed: Secondary | ICD-10-CM | POA: Diagnosis not present

## 2021-02-04 DIAGNOSIS — M79671 Pain in right foot: Secondary | ICD-10-CM

## 2021-02-04 MED ORDER — DOXYCYCLINE HYCLATE 100 MG PO TABS: 100.0000 mg | ORAL_TABLET | Freq: Two times a day (BID) | ORAL | 0 refills | Status: AC

## 2021-02-04 MED ORDER — GENTAMICIN SULFATE 0.1 % EX CREA: 1.0000 "application " | TOPICAL_CREAM | Freq: Two times a day (BID) | CUTANEOUS | 1 refills | Status: AC

## 2021-02-04 NOTE — Progress Notes (Signed)
   Subjective:  70 y.o. female with PMHx of diabetes mellitus presenting today for new complaint of a blister that developed to the plantar aspect of the right foot.  Patient has been having pain and sensitivity to the area as well.  This is developed over the past month.  Patient also has a chronic history of DJD and capsulitis to the left ankle.  She says the injections helped significantly in the past.  She presents for further treatment and evaluation   Past Medical History:  Diagnosis Date   Diabetes mellitus without complication (HCC)        Objective/Physical Exam General: The patient is alert and oriented x3 in no acute distress.  Dermatology:  Wound #1 noted to the plantar aspect of the fifth MTP joint right foot measuring approximately 3.0 x 3.0 x 0.2 cm (LxWxD).  Please see above photo  To the noted ulceration(s), there is no eschar. There is a moderate amount of slough, fibrin, and necrotic tissue noted. Granulation tissue and wound base is red. There is a minimal amount of serosanguineous drainage noted. There is no exposed bone muscle-tendon ligament or joint. There is no malodor. Periwound integrity is intact. Skin is warm, dry and supple bilateral lower extremities.  Vascular: Palpable pedal pulses bilaterally. No edema or erythema noted. Capillary refill within normal limits.  Neurological: Epicritic and protective threshold diminished bilaterally.   Musculoskeletal Exam: DJD with pain on palpation left ankle and insertional peroneal tendon onto the fifth metatarsal tubercle  Assessment: 1.  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 diabetes mellitus w/ peripheral neuropathy 3.  DJD/capsulitis left ankle 4.  Insertional peroneal tendinitis left   Plan of Care:  1. Patient was evaluated. 2. medically necessary excisional debridement including subcutaneous tissue was performed using a tissue nipper and a chisel blade. Excisional debridement of  all the necrotic nonviable tissue down to healthy bleeding viable tissue was performed with post-debridement measurements same as pre-. 3. the wound was cleansed and dry sterile dressing applied. 4.  Cultures taken and sent to pathology for culture and sensitivity 3.  Prescription for gentamicin cream applied daily 4.  Prescription for doxycycline 100 mg 2 times daily #20 5.  Postsurgical shoe dispensed.  Weightbearing as tolerated 6.  Return to clinic in 3 weeks.  At this time we will consider steroid injection to the left ankle and insertional peroneal tendon if we are certain that the infection has resolved.  Routine nail trim also next visit   Felecia Shelling, DPM [facility]  Dr. Felecia Shelling, DPM    [address]                                        [place], [place] [address]                Office [phone]  Fax [phone]

## 2021-02-08 LAB — WOUND CULTURE
MICRO NUMBER:: 12493938
SPECIMEN QUALITY:: ADEQUATE

## 2021-02-08 LAB — HOUSE ACCOUNT TRACKING

## 2021-02-25 ENCOUNTER — Other Ambulatory Visit: Payer: Self-pay

## 2021-02-25 ENCOUNTER — Encounter: Payer: Self-pay | Admitting: Podiatry

## 2021-02-25 ENCOUNTER — Ambulatory Visit: Payer: Medicare Other | Admitting: Podiatry

## 2021-02-25 DIAGNOSIS — E08621 Diabetes mellitus due to underlying condition with foot ulcer: Secondary | ICD-10-CM

## 2021-02-25 DIAGNOSIS — M79674 Pain in right toe(s): Secondary | ICD-10-CM | POA: Diagnosis not present

## 2021-02-25 DIAGNOSIS — M79675 Pain in left toe(s): Secondary | ICD-10-CM | POA: Diagnosis not present

## 2021-02-25 DIAGNOSIS — M659 Synovitis and tenosynovitis, unspecified: Secondary | ICD-10-CM | POA: Diagnosis not present

## 2021-02-25 DIAGNOSIS — L97512 Non-pressure chronic ulcer of other part of right foot with fat layer exposed: Secondary | ICD-10-CM

## 2021-02-25 DIAGNOSIS — B351 Tinea unguium: Secondary | ICD-10-CM

## 2021-02-25 DIAGNOSIS — M65972 Unspecified synovitis and tenosynovitis, left ankle and foot: Secondary | ICD-10-CM

## 2021-02-25 MED ORDER — BETAMETHASONE SOD PHOS & ACET 6 (3-3) MG/ML IJ SUSP
3.0000 mg | Freq: Once | INTRAMUSCULAR | Status: AC
  Administered 2021-02-25: 3 mg via INTRA_ARTICULAR

## 2021-02-25 NOTE — Progress Notes (Signed)
   Subjective:  69 y.o. female with PMHx of diabetes mellitus presenting today for follow-up evaluation of a blister that developed to the plantar aspect of the right foot.  Patient also has a chronic history of DJD and capsulitis to the left ankle.  She says the injections helped significantly in the past.  She presents for further treatment and evaluation   Past Medical History:  Diagnosis Date   Diabetes mellitus without complication (HCC)      Objective/Physical Exam General: The patient is alert and oriented x3 in no acute distress.  Dermatology:  Wound #1 noted to the plantar aspect of the fifth MTP joint right foot has healed.  Complete reepithelialization has occurred. Skin is warm, dry and supple bilateral lower extremities.  Vascular: Palpable pedal pulses bilaterally. No edema or erythema noted. Capillary refill within normal limits.  Neurological: Epicritic and protective threshold diminished bilaterally.   Musculoskeletal Exam: DJD with pain on palpation left ankle the fifth metatarsal tubercle is actually asymptomatic today. Assessment: 1.  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 diabetes mellitus w/ peripheral neuropathy 3.  DJD/capsulitis left ankle 4.  Pain due to onychomycosis of toenails both    Plan of Care:  1. Patient was evaluated. 2.  Overall there is significant improvement of the right foot wound.  It is actually completely epithelialized and healed.  She may resume regular activity.  Advised and cautioned her against going barefoot around the house or outside.  Recommend even good supportive sneakers and how 3.  Injection of 0.5 cc Celestone Soluspan injection left ankle joint 4.  Patient may resume full activity no restrictions.  Discontinue postsurgical shoe 5.  Mechanical debridement of nails 1-5 bilateral was performed using nail nipper without incident or bleeding 6.  Return to clinic in 3 months for routine foot  care   Felecia Shelling, DPM [facility]  Dr. Felecia Shelling, DPM    [address] [place], [place] [address]                Office [phone]  Fax [phone]

## 2021-03-02 ENCOUNTER — Ambulatory Visit (INDEPENDENT_AMBULATORY_CARE_PROVIDER_SITE_OTHER): Payer: Medicare Other | Admitting: Podiatrist

## 2021-03-02 ENCOUNTER — Other Ambulatory Visit: Payer: Self-pay

## 2021-03-02 DIAGNOSIS — E08621 Diabetes mellitus due to underlying condition with foot ulcer: Secondary | ICD-10-CM

## 2021-03-02 DIAGNOSIS — L97512 Non-pressure chronic ulcer of other part of right foot with fat layer exposed: Secondary | ICD-10-CM

## 2021-03-02 DIAGNOSIS — M19072 Primary osteoarthritis, left ankle and foot: Secondary | ICD-10-CM

## 2021-03-02 NOTE — Progress Notes (Signed)
DIABETIC SHOES CASTING:  Patient presented for foam casting for 3 pr custom diabetic shoe inserts-  Patient is measured with a Brannok device to be a size 10.5 Wide  Diabetic shoes are chosen from the [employer].  The shoes chosen are the miranda in black A350w  The patient will be contacted when the shoes and insert are ready to be picked up.

## 2021-03-23 ENCOUNTER — Telehealth: Payer: Self-pay | Admitting: Podiatry

## 2021-03-23 NOTE — Telephone Encounter (Signed)
Pt left message checking on status of diabetic shoes. Upon checking the documents needed have not been signed off on yet. I told pt the doctors name and she said she has never heard of that doctor (he was listed as her pcp in the sytstem) She see's a PA and I have changed it in the system, I have called and gotten the md/do that oversee's the pa and will fax the documents to that office.

## 2021-03-30 ENCOUNTER — Other Ambulatory Visit: Payer: Self-pay

## 2021-03-30 ENCOUNTER — Ambulatory Visit: Payer: Medicare Other | Admitting: Podiatry

## 2021-03-30 ENCOUNTER — Ambulatory Visit (INDEPENDENT_AMBULATORY_CARE_PROVIDER_SITE_OTHER): Payer: Medicare Other

## 2021-03-30 DIAGNOSIS — M722 Plantar fascial fibromatosis: Secondary | ICD-10-CM

## 2021-03-30 DIAGNOSIS — M7672 Peroneal tendinitis, left leg: Secondary | ICD-10-CM

## 2021-03-30 MED ORDER — BETAMETHASONE SOD PHOS & ACET 6 (3-3) MG/ML IJ SUSP
3.0000 mg | Freq: Once | INTRAMUSCULAR | Status: AC
  Administered 2021-03-30: 3 mg via INTRA_ARTICULAR

## 2021-03-30 MED ORDER — MELOXICAM 15 MG PO TABS: 15.0000 mg | ORAL_TABLET | Freq: Every day | ORAL | 1 refills | Status: DC

## 2021-03-30 NOTE — Progress Notes (Signed)
   Subjective: 70 y.o. female presenting for evaluation of left heel pain and lateral foot pain this been going on for about 2 weeks now.  Patient states that she stepped on something in her house and she is been having lateral heel pain ever since.  The pain extends all the way down to the lateral column of the foot.  She has not done anything for treatment.  She says it is very painful to walk on.  She presents for further treatment evaluation   Past Medical History:  Diagnosis Date   Diabetes mellitus without complication (HCC)      Objective: Physical Exam General: The patient is alert and oriented x3 in no acute distress.  Dermatology: Skin is warm, dry and supple bilateral lower extremities. Negative for open lesions or macerations bilateral.   Vascular: Dorsalis Pedis and Posterior Tibial pulses palpable bilateral.  Capillary fill time is immediate to all digits.  Neurological: Epicritic and protective threshold intact bilateral.   Musculoskeletal: Tenderness to palpation to the plantar aspect of the left heel along the plantar fascia. All other joints range of motion within normal limits bilateral. Strength 5/5 in all groups bilateral.   Radiographic exam: Normal osseous mineralization.  Advanced degenerative changes noted throughout the midtarsal joints of the foot especially the TN joint.. No fracture/dislocation/boney destruction. No other soft tissue abnormalities or radiopaque foreign bodies.   Assessment: 1. Plantar fasciitis left foot; lateral aspect 2.  Insertional peroneal tendinitis fifth metatarsal tubercle left  Plan of Care:  1. Patient evaluated. Xrays reviewed.   2. Injection of 0.5cc Celestone soluspan injected into the left plantar fascia and along the peroneal tendon sheath left at the insertion of the fifth metatarsal tubercle..  3. Rx for Meloxicam ordered for patient. 4.  Cam boot dispensed.  Weightbearing as tolerated x4 weeks  5.  Return to clinic in 4  weeks.     Felecia Shelling, DPM [facility]  Dr. Felecia Shelling, DPM    [address] [place], [place] [address]                Office [phone]  Fax [phone]    Lateral aspect as well as the fifth metatarsal tubercle at the insertion of the peroneal tendon

## 2021-04-29 ENCOUNTER — Telehealth: Payer: Self-pay | Admitting: Podiatry

## 2021-04-29 NOTE — Telephone Encounter (Signed)
There was a voicemail left for me from pt when I was out of the office.  I returned call and it was pts husband and he said it was probably from his wife about her diabetic shoes. I told him that it looks like they should be shipping anytime and I would call to schedule an appt when they come in.

## 2021-05-05 ENCOUNTER — Telehealth: Payer: Self-pay | Admitting: Podiatry

## 2021-05-05 NOTE — Telephone Encounter (Signed)
Diabetic shoes/inserts in..lvm for pt to call to schedule an appt to pick them up. 

## 2021-06-01 ENCOUNTER — Ambulatory Visit: Payer: Medicare Other | Admitting: Podiatry

## 2021-06-04 ENCOUNTER — Other Ambulatory Visit: Payer: Self-pay

## 2021-06-04 ENCOUNTER — Ambulatory Visit (INDEPENDENT_AMBULATORY_CARE_PROVIDER_SITE_OTHER): Payer: Medicare Other

## 2021-06-04 DIAGNOSIS — E1165 Type 2 diabetes mellitus with hyperglycemia: Secondary | ICD-10-CM

## 2021-06-04 DIAGNOSIS — L97512 Non-pressure chronic ulcer of other part of right foot with fat layer exposed: Secondary | ICD-10-CM

## 2021-06-04 DIAGNOSIS — E08621 Diabetes mellitus due to underlying condition with foot ulcer: Secondary | ICD-10-CM | POA: Diagnosis not present

## 2021-06-04 NOTE — Progress Notes (Signed)
SITUATION Reason for Visit: Fitting of Diabetic Shoes & Insoles Patient / Caregiver Report:  Patient reports comfort and is satisfied  OBJECTIVE DATA: Patient History / Diagnosis:     ICD-10-CM   1. Type 2 diabetes mellitus with hyperglycemia, unspecified whether long term insulin use (HCC)  E11.65     2. Diabetic ulcer of other part of right foot associated with diabetes mellitus due to underlying condition, with fat layer exposed (HCC)  P53.614    L97.512       Change in Status:   None  ACTIONS PERFORMED: In-Person Delivery, patient was fit with: - 1x pair A5500 PDAC approved prefabricated Diabetic Shoes: Apex A350W 10.5W - 3x pair [id number] PDAC approved CAM milled custom diabetic insoles  Shoes and insoles were verified for structural integrity and safety. Patient wore shoes and insoles in office. Skin was inspected and free of areas of concern after wearing shoes and inserts. Shoes and inserts fit properly. Patient / Caregiver provided with ferbal instruction and demonstration regarding donning, doffing, wear, care, proper fit, function, purpose, cleaning, and use of shoes and insoles ' and in all related precautions and risks and benefits regarding shoes and insoles. Patient / Caregiver was instructed to wear properly fitting socks with shoes at all times. Patient was also provided with verbal instruction regarding how to report any failures or malfunctions of shoes or inserts, and necessary follow up care. Patient / Caregiver was also instructed to contact physician regarding change in status that may affect function of shoes and inserts.   Patient / Caregiver verbalized undersatnding of instruction provided. Patient / Caregiver demonstrated independence with proper donning and doffing of shoes and inserts.  PLAN Patient to follow up as needed. Plan of care was discussed with and agreed upon by patient and/or caregiver. All questions were answered and concerns addressed.

## 2021-06-06 ENCOUNTER — Other Ambulatory Visit: Payer: Self-pay | Admitting: Podiatry

## 2021-07-31 ENCOUNTER — Other Ambulatory Visit: Payer: Self-pay | Admitting: Podiatry

## 2021-08-12 ENCOUNTER — Ambulatory Visit: Payer: Medicare Other | Admitting: Podiatry

## 2021-08-12 DIAGNOSIS — M722 Plantar fascial fibromatosis: Secondary | ICD-10-CM | POA: Diagnosis not present

## 2021-08-12 DIAGNOSIS — M79675 Pain in left toe(s): Secondary | ICD-10-CM

## 2021-08-12 DIAGNOSIS — M79674 Pain in right toe(s): Secondary | ICD-10-CM | POA: Diagnosis not present

## 2021-08-12 DIAGNOSIS — B351 Tinea unguium: Secondary | ICD-10-CM

## 2021-08-12 MED ORDER — BETAMETHASONE SOD PHOS & ACET 6 (3-3) MG/ML IJ SUSP
3.0000 mg | Freq: Once | INTRAMUSCULAR | Status: AC
  Administered 2021-08-12: 3 mg via INTRA_ARTICULAR

## 2021-08-12 NOTE — Progress Notes (Signed)
? ?  SUBJECTIVE ?Patient presents to office today complaining of elongated, thickened nails that cause pain while ambulating in shoes.  Patient is unable to trim their own nails.  ? ?Patient also states that she has had an acute flareup of heel pain bilateral.  She has received injections in the past which helped.  She says that most recently over the last few weeks she has had increased pain and tenderness to the bilateral feet.  Patient is here for further evaluation and treatment. ? ?Past Medical History:  ?Diagnosis Date  ? Diabetes mellitus without complication ([place])   ? ?Past Surgical History:  ?Procedure Laterality Date  ? LEG SURGERY    ? TUBAL LIGATION    ? ?Allergies  ?Allergen Reactions  ? Metformin And Related Diarrhea  ? ?OBJECTIVE ?General Patient is awake, alert, and oriented x 3 and in no acute distress. ?Derm Skin is dry and supple bilateral. Negative open lesions or macerations. Remaining integument unremarkable. Nails are tender, long, thickened and dystrophic with subungual debris, consistent with onychomycosis, 1-5 bilateral. No signs of infection noted. ?Vasc  DP and PT pedal pulses palpable bilaterally. Temperature gradient within normal limits.  ?Neuro Epicritic and protective threshold sensation grossly intact bilaterally.  ?Musculoskeletal Exam No symptomatic pedal deformities noted bilateral. Muscular strength within normal limits.  Pain on palpation to the bilateral heels consistent with plantar fasciitis ? ?ASSESSMENT ?1.  Pain due to onychomycosis of toenails both ?2.  Plantar fasciitis bilateral ? ?PLAN OF CARE ?1. Patient evaluated today.  ?2. Instructed to maintain good pedal hygiene and foot care.  ?3. Mechanical debridement of nails 1-5 bilaterally performed using a nail nipper. Filed with dremel without incident.  ?4.  Injection of 0.5 cc Celestone Soluspan injection of the bilateral plantar fascia  ?5.  Continue meloxicam and gabapentin as per PCP  ?6.  Return to clinic in 3 mos.   ? ? ?Edrick Kins, DPM ?[place] ? ?Dr. Edrick Kins, DPM  ?  ?[address]                                     ?[place], [place] [address]                ?Office [phone]  ?Fax [phone] ? ? ? ? ?

## 2021-09-16 ENCOUNTER — Other Ambulatory Visit (HOSPITAL_BASED_OUTPATIENT_CLINIC_OR_DEPARTMENT_OTHER): Payer: Self-pay | Admitting: Physician Assistant

## 2021-09-16 ENCOUNTER — Ambulatory Visit (HOSPITAL_BASED_OUTPATIENT_CLINIC_OR_DEPARTMENT_OTHER)
Admission: RE | Admit: 2021-09-16 | Discharge: 2021-09-16 | Disposition: A | Discharge status: Home / Self Care | Payer: Medicare Other | Source: Ambulatory Visit | Attending: Physician Assistant | Admitting: Physician Assistant

## 2021-09-16 DIAGNOSIS — M79661 Pain in right lower leg: Secondary | ICD-10-CM | POA: Diagnosis present

## 2021-09-16 DIAGNOSIS — M7989 Other specified soft tissue disorders: Secondary | ICD-10-CM | POA: Insufficient documentation

## 2021-10-05 ENCOUNTER — Ambulatory Visit: Payer: Medicare Other | Admitting: Podiatry

## 2021-10-05 DIAGNOSIS — M79674 Pain in right toe(s): Secondary | ICD-10-CM | POA: Diagnosis not present

## 2021-10-05 DIAGNOSIS — M7751 Other enthesopathy of right foot: Secondary | ICD-10-CM

## 2021-10-05 DIAGNOSIS — M79675 Pain in left toe(s): Secondary | ICD-10-CM | POA: Diagnosis not present

## 2021-10-05 DIAGNOSIS — B351 Tinea unguium: Secondary | ICD-10-CM | POA: Diagnosis not present

## 2021-10-05 DIAGNOSIS — M7752 Other enthesopathy of left foot: Secondary | ICD-10-CM

## 2021-10-06 DIAGNOSIS — M7752 Other enthesopathy of left foot: Secondary | ICD-10-CM | POA: Diagnosis not present

## 2021-10-06 DIAGNOSIS — M7751 Other enthesopathy of right foot: Secondary | ICD-10-CM | POA: Diagnosis not present

## 2021-10-06 MED ORDER — BETAMETHASONE SOD PHOS & ACET 6 (3-3) MG/ML IJ SUSP
3.0000 mg | Freq: Once | INTRAMUSCULAR | Status: AC
  Administered 2021-10-06: 3 mg via INTRA_ARTICULAR

## 2021-10-06 NOTE — Progress Notes (Signed)
   SUBJECTIVE Patient presents to office today complaining of elongated, thickened nails that cause pain while ambulating in shoes.  Patient is unable to trim their own nails.   Patient is also experiencing increased pain to the bilateral ankles.  She says that her ankles have been hurting for several months.  She denies any change in history or injury to the areas.  She says this is chronic and ongoing.  Injections helped significantly with the pain and she is requesting injections today patient is here for further evaluation and treatment.  Past Medical History:  Diagnosis Date   Diabetes mellitus without complication ([facility])    Past Surgical History:  Procedure Laterality Date   LEG SURGERY     TUBAL LIGATION     Allergies  Allergen Reactions   Metformin And Related Diarrhea     OBJECTIVE General Patient is awake, alert, and oriented x 3 and in no acute distress. Derm Skin is dry and supple bilateral. Negative open lesions or macerations. Remaining integument unremarkable. Nails are tender, long, thickened and dystrophic with subungual debris, consistent with onychomycosis, 1-5 bilateral. No signs of infection noted. Vasc  DP and PT pedal pulses palpable bilaterally. Temperature gradient within normal limits.  Neuro Epicritic and protective threshold sensation grossly intact bilaterally.  Musculoskeletal Exam No symptomatic pedal deformities noted bilateral. Muscular strength within normal limits.  There is pain on palpation throughout the bilateral ankle joints.  Negative for any significant pain with range of motion.  ASSESSMENT 1.  Pain due to onychomycosis of toenails both 2.  Capsulitis bilateral ankles  PLAN OF CARE 1. Patient evaluated today.  2. Instructed to maintain good pedal hygiene and foot care.  3. Mechanical debridement of nails 1-5 bilaterally performed using a nail nipper. Filed with dremel without incident.  4.  Injection of 0.5 cc Celestone Soluspan injected  into the bilateral ankle joints 5.  Return to clinic in 3 mos.    Felecia Shelling, DPM [facility]  Dr. Felecia Shelling, DPM    [address] [place], [place] [address]                Office [phone]  Fax [phone]

## 2021-10-22 ENCOUNTER — Other Ambulatory Visit: Payer: Self-pay | Admitting: Family Medicine

## 2021-10-22 DIAGNOSIS — R2241 Localized swelling, mass and lump, right lower limb: Secondary | ICD-10-CM

## 2021-11-04 ENCOUNTER — Encounter (HOSPITAL_COMMUNITY): Payer: Self-pay

## 2021-11-04 ENCOUNTER — Inpatient Hospital Stay (HOSPITAL_COMMUNITY)
Admission: EM | Admit: 2021-11-04 | Discharge: 2021-11-24 | DRG: 291 | Disposition: E | Payer: Medicare Other | Attending: Internal Medicine | Admitting: Internal Medicine

## 2021-11-04 ENCOUNTER — Other Ambulatory Visit: Payer: Self-pay

## 2021-11-04 ENCOUNTER — Emergency Department (HOSPITAL_COMMUNITY): Payer: Medicare Other

## 2021-11-04 DIAGNOSIS — Z6837 Body mass index (BMI) 37.0-37.9, adult: Secondary | ICD-10-CM

## 2021-11-04 DIAGNOSIS — Z716 Tobacco abuse counseling: Secondary | ICD-10-CM | POA: Diagnosis not present

## 2021-11-04 DIAGNOSIS — E785 Hyperlipidemia, unspecified: Secondary | ICD-10-CM | POA: Diagnosis present

## 2021-11-04 DIAGNOSIS — Z888 Allergy status to other drugs, medicaments and biological substances status: Secondary | ICD-10-CM

## 2021-11-04 DIAGNOSIS — Z20822 Contact with and (suspected) exposure to covid-19: Secondary | ICD-10-CM | POA: Diagnosis present

## 2021-11-04 DIAGNOSIS — E162 Hypoglycemia, unspecified: Secondary | ICD-10-CM

## 2021-11-04 DIAGNOSIS — E875 Hyperkalemia: Secondary | ICD-10-CM | POA: Diagnosis present

## 2021-11-04 DIAGNOSIS — N179 Acute kidney failure, unspecified: Secondary | ICD-10-CM | POA: Diagnosis present

## 2021-11-04 DIAGNOSIS — E669 Obesity, unspecified: Secondary | ICD-10-CM | POA: Diagnosis not present

## 2021-11-04 DIAGNOSIS — I83891 Varicose veins of right lower extremities with other complications: Secondary | ICD-10-CM | POA: Diagnosis present

## 2021-11-04 DIAGNOSIS — Z8249 Family history of ischemic heart disease and other diseases of the circulatory system: Secondary | ICD-10-CM | POA: Diagnosis not present

## 2021-11-04 DIAGNOSIS — F17210 Nicotine dependence, cigarettes, uncomplicated: Secondary | ICD-10-CM | POA: Diagnosis present

## 2021-11-04 DIAGNOSIS — E1169 Type 2 diabetes mellitus with other specified complication: Secondary | ICD-10-CM | POA: Insufficient documentation

## 2021-11-04 DIAGNOSIS — I472 Ventricular tachycardia, unspecified: Secondary | ICD-10-CM | POA: Diagnosis not present

## 2021-11-04 DIAGNOSIS — R778 Other specified abnormalities of plasma proteins: Secondary | ICD-10-CM | POA: Diagnosis present

## 2021-11-04 DIAGNOSIS — J9601 Acute respiratory failure with hypoxia: Secondary | ICD-10-CM | POA: Diagnosis present

## 2021-11-04 DIAGNOSIS — Z833 Family history of diabetes mellitus: Secondary | ICD-10-CM | POA: Diagnosis not present

## 2021-11-04 DIAGNOSIS — E11649 Type 2 diabetes mellitus with hypoglycemia without coma: Secondary | ICD-10-CM | POA: Diagnosis present

## 2021-11-04 DIAGNOSIS — I5031 Acute diastolic (congestive) heart failure: Secondary | ICD-10-CM | POA: Diagnosis present

## 2021-11-04 DIAGNOSIS — I509 Heart failure, unspecified: Principal | ICD-10-CM

## 2021-11-04 DIAGNOSIS — E1142 Type 2 diabetes mellitus with diabetic polyneuropathy: Secondary | ICD-10-CM | POA: Diagnosis present

## 2021-11-04 DIAGNOSIS — I5021 Acute systolic (congestive) heart failure: Secondary | ICD-10-CM | POA: Diagnosis present

## 2021-11-04 DIAGNOSIS — I255 Ischemic cardiomyopathy: Secondary | ICD-10-CM | POA: Diagnosis present

## 2021-11-04 DIAGNOSIS — Z7984 Long term (current) use of oral hypoglycemic drugs: Secondary | ICD-10-CM | POA: Diagnosis not present

## 2021-11-04 DIAGNOSIS — E119 Type 2 diabetes mellitus without complications: Secondary | ICD-10-CM

## 2021-11-04 DIAGNOSIS — Z720 Tobacco use: Secondary | ICD-10-CM | POA: Diagnosis present

## 2021-11-04 DIAGNOSIS — Z79899 Other long term (current) drug therapy: Secondary | ICD-10-CM

## 2021-11-04 HISTORY — DX: Obesity, unspecified: E66.9

## 2021-11-04 HISTORY — DX: Heart failure, unspecified: I50.9

## 2021-11-04 HISTORY — DX: Type 2 diabetes mellitus with diabetic neuropathy, unspecified: E11.40

## 2021-11-04 HISTORY — DX: Asymptomatic varicose veins of unspecified lower extremity: I83.90

## 2021-11-04 LAB — CBC WITH DIFFERENTIAL/PLATELET
Abs Immature Granulocytes: 0.05 10*3/uL (ref 0.00–0.07)
Basophils Absolute: 0.1 10*3/uL (ref 0.0–0.1)
Basophils Relative: 1 %
Eosinophils Absolute: 0.2 10*3/uL (ref 0.0–0.5)
Eosinophils Relative: 3 %
HCT: 38.2 % (ref 36.0–46.0)
Hemoglobin: 12.3 g/dL (ref 12.0–15.0)
Immature Granulocytes: 1 %
Lymphocytes Relative: 12 %
Lymphs Abs: 0.8 10*3/uL (ref 0.7–4.0)
MCH: 29.8 pg (ref 26.0–34.0)
MCHC: 32.2 g/dL (ref 30.0–36.0)
MCV: 92.5 fL (ref 80.0–100.0)
Monocytes Absolute: 0.4 10*3/uL (ref 0.1–1.0)
Monocytes Relative: 6 %
Neutro Abs: 5.5 10*3/uL (ref 1.7–7.7)
Neutrophils Relative %: 77 %
Platelets: 199 10*3/uL (ref 150–400)
RBC: 4.13 MIL/uL (ref 3.87–5.11)
RDW: 17.9 % — ABNORMAL HIGH (ref 11.5–15.5)
WBC: 7 10*3/uL (ref 4.0–10.5)
nRBC: 0 % (ref 0.0–0.2)

## 2021-11-04 LAB — COMPREHENSIVE METABOLIC PANEL
ALT: 12 U/L (ref 0–44)
AST: 26 U/L (ref 15–41)
Albumin: 3.4 g/dL — ABNORMAL LOW (ref 3.5–5.0)
Alkaline Phosphatase: 87 U/L (ref 38–126)
Anion gap: 9 (ref 5–15)
BUN: 31 mg/dL — ABNORMAL HIGH (ref 8–23)
CO2: 25 mmol/L (ref 22–32)
Calcium: 9.1 mg/dL (ref 8.9–10.3)
Chloride: 104 mmol/L (ref 98–111)
Creatinine, Ser: 1.52 mg/dL — ABNORMAL HIGH (ref 0.44–1.00)
GFR, Estimated: 37 mL/min — ABNORMAL LOW (ref >60)
Glucose, Bld: 61 mg/dL — ABNORMAL LOW (ref 70–99)
Potassium: 6.1 mmol/L — ABNORMAL HIGH (ref 3.5–5.1)
Sodium: 138 mmol/L (ref 135–145)
Total Bilirubin: 0.7 mg/dL (ref 0.3–1.2)
Total Protein: 7.5 g/dL (ref 6.5–8.1)

## 2021-11-04 LAB — MAGNESIUM: Magnesium: 1.3 mg/dL — ABNORMAL LOW (ref 1.7–2.4)

## 2021-11-04 LAB — TROPONIN I (HIGH SENSITIVITY)
Troponin I (High Sensitivity): 48 ng/L — ABNORMAL HIGH (ref <18)
Troponin I (High Sensitivity): 77 ng/L — ABNORMAL HIGH (ref <18)
Troponin I (High Sensitivity): 57 ng/L — ABNORMAL HIGH (ref <18)

## 2021-11-04 LAB — BRAIN NATRIURETIC PEPTIDE: B Natriuretic Peptide: 594.2 pg/mL — ABNORMAL HIGH (ref 0.0–100.0)

## 2021-11-04 MED ORDER — ACETAMINOPHEN 650 MG RE SUPP: 650.0000 mg | Freq: Four times a day (QID) | RECTAL | Status: DC | PRN

## 2021-11-04 MED ORDER — ATORVASTATIN CALCIUM 40 MG PO TABS
40.0000 mg | ORAL_TABLET | Freq: Every day | ORAL | Status: DC
  Administered 2021-11-05 – 2021-11-06 (×2): 40 mg via ORAL
  Filled 2021-11-04 (×2): qty 1

## 2021-11-04 MED ORDER — CALCIUM GLUCONATE-NACL 1-0.675 GM/50ML-% IV SOLN
1.0000 g | Freq: Once | INTRAVENOUS | Status: AC
  Administered 2021-11-05: 1000 mg via INTRAVENOUS
  Filled 2021-11-04: qty 50

## 2021-11-04 MED ORDER — FUROSEMIDE 10 MG/ML IJ SOLN
20.0000 mg | Freq: Two times a day (BID) | INTRAMUSCULAR | Status: DC
  Administered 2021-11-05 (×2): 20 mg via INTRAVENOUS
  Filled 2021-11-04 (×2): qty 2

## 2021-11-04 MED ORDER — SODIUM ZIRCONIUM CYCLOSILICATE 10 G PO PACK
10.0000 g | PACK | Freq: Once | ORAL | Status: AC
Start: 2021-11-04 — End: 2021-11-05
  Administered 2021-11-05: 10 g via ORAL
  Filled 2021-11-04: qty 1

## 2021-11-04 MED ORDER — ALBUTEROL SULFATE HFA 108 (90 BASE) MCG/ACT IN AERS
2.0000 | INHALATION_SPRAY | Freq: Four times a day (QID) | RESPIRATORY_TRACT | Status: DC
Start: 2021-11-04 — End: 2021-11-05
  Administered 2021-11-04 – 2021-11-05 (×6): 2 via RESPIRATORY_TRACT
  Filled 2021-11-04 (×3): qty 6.7

## 2021-11-04 MED ORDER — DEXTROSE 50 % IV SOLN: 1.0000 | INTRAVENOUS | Status: DC | PRN

## 2021-11-04 MED ORDER — NICOTINE 14 MG/24HR TD PT24: 14.0000 mg | MEDICATED_PATCH | Freq: Every day | TRANSDERMAL | Status: DC | PRN

## 2021-11-04 MED ORDER — ACETAMINOPHEN 325 MG PO TABS: 650.0000 mg | ORAL_TABLET | Freq: Four times a day (QID) | ORAL | Status: DC | PRN

## 2021-11-04 MED ORDER — ASPIRIN 81 MG PO CHEW
324.0000 mg | CHEWABLE_TABLET | Freq: Once | ORAL | Status: AC
  Administered 2021-11-04: 324 mg via ORAL
  Filled 2021-11-04: qty 4

## 2021-11-04 MED ORDER — INSULIN ASPART 100 UNIT/ML IJ SOLN
0.0000 [IU] | Freq: Three times a day (TID) | INTRAMUSCULAR | Status: DC
  Administered 2021-11-06: 1 [IU] via SUBCUTANEOUS

## 2021-11-04 MED ORDER — FUROSEMIDE 10 MG/ML IJ SOLN
20.0000 mg | Freq: Once | INTRAMUSCULAR | Status: AC
  Administered 2021-11-04: 20 mg via INTRAVENOUS
  Filled 2021-11-04: qty 2

## 2021-11-04 NOTE — ED Notes (Signed)
Breathing techniques shared with the patient, patient reports understanding of provided education while using the inhaler.

## 2021-11-04 NOTE — ED Notes (Signed)
2nd troponin has increase and such has been reported to Shona Simpson MD

## 2021-11-04 NOTE — ED Notes (Signed)
This RN called CT who reports that they are working on getting the patient over as soon as possible

## 2021-11-04 NOTE — ED Provider Notes (Signed)
[facility] EMERGENCY DEPARTMENT Provider Note   CSN: [id number] Arrival date & time: 2021/11/17  1455     History  Chief Complaint  Patient presents with   Shortness of Breath    Marie Stone is a 71 y.o. female.  HPI 71 year old female presents with shortness of breath and leg swelling.  History is initially from the husband who tells me about how she has had right leg swelling for months and had a negative DVT ultrasound.  Has been followed with podiatry for plantar fasciitis.  However over the past several days she has developed swelling in the left leg and now some shortness of breath.  No chest pain.  She has had 40+ pound weight gain since February.  Doctor sent her over via EMS for concern for CHF and need for diuresis.  Home Medications Prior to Admission medications   Medication Sig Start Date End Date Taking? Authorizing Provider  atorvastatin (LIPITOR) 40 MG tablet Take 40 mg by mouth daily. 02/12/21  Yes [provider]  gabapentin (NEURONTIN) 300 MG capsule Take 300 mg by mouth 3 (three) times daily. 12/16/20  Yes [provider]  glimepiride (AMARYL) 2 MG tablet Take 2 mg by mouth 2 (two) times daily. 04/16/18  Yes [provider]  JARDIANCE 25 MG TABS tablet Take 25 mg by mouth daily. 05/19/18  Yes [provider]  lisinopril (ZESTRIL) 10 MG tablet Take 1 tablet by mouth daily. 12/16/20  Yes [provider]  sitaGLIPtin (JANUVIA) 100 MG tablet Take 100 mg by mouth daily.   Yes [provider]  Blood Glucose Monitoring Suppl DEVI Use as directed twice daily. Dispense meter best covered by insurance 07/25/14   [provider]  doxycycline (VIBRA-TABS) 100 MG tablet Take 1 tablet (100 mg total) by mouth 2 (two) times daily. 02/04/21   Felecia Shelling, DPM  gentamicin cream (GARAMYCIN) 0.1 % Apply 1 application topically 2 (two) times daily. 02/04/21   Felecia Shelling, DPM  Glucose Blood (BLOOD  GLUCOSE TEST STRIPS) STRP Use to check blood sugar 2 times per day. 07/21/16   Romero Belling, MD  glucose blood [facility] VERIO) test strip 1 each by Other route 2 (two) times daily. And lancets 2/day 07/19/16   Romero Belling, MD  Lancets MISC Use to check blood sugar 2 times per day. 07/22/16   Romero Belling, MD  meloxicam (MOBIC) 15 MG tablet TAKE 1 TABLET (15 MG TOTAL) BY MOUTH DAILY. 06/08/21   Felecia Shelling, DPM      Allergies    Metformin and related    Review of Systems   Review of Systems  Constitutional:  Positive for unexpected weight change. Negative for fever.  Respiratory:  Positive for shortness of breath.   Cardiovascular:  Positive for leg swelling. Negative for chest pain.  Gastrointestinal:  Positive for abdominal distention. Negative for abdominal pain.    Physical Exam Updated Vital Signs BP 110/64 (BP Location: Left Arm)   Pulse 89   Temp 98.6 F (37 C)   Resp 16   Ht 5\' 4"  (1.626 m)   Wt 89.4 kg   SpO2 95%   BMI 33.81 kg/m  Physical Exam Vitals and nursing note reviewed.  Constitutional:      General: She is not in acute distress.    Appearance: She is well-developed. She is obese. She is not ill-appearing or diaphoretic.  HENT:     Head: Normocephalic and atraumatic.  Cardiovascular:  Rate and Rhythm: Normal rate and regular rhythm.     Heart sounds: Normal heart sounds.  Pulmonary:     Effort: Pulmonary effort is normal.     Breath sounds: Wheezing (mild, diffuse, expiratory) present.  Abdominal:     Palpations: Abdomen is soft.     Tenderness: There is no abdominal tenderness.  Musculoskeletal:     Comments: Chronic right lower extremity changes without cellulitis.  Both lower extremities have edema.  Skin:    General: Skin is warm and dry.  Neurological:     Mental Status: She is alert.     ED Results / Procedures / Treatments   Labs (all labs ordered are listed, but only abnormal results are displayed) Labs Reviewed  BRAIN  NATRIURETIC PEPTIDE - Abnormal; Notable for the following components:      Result Value   B Natriuretic Peptide 594.2 (*)    All other components within normal limits  COMPREHENSIVE METABOLIC PANEL - Abnormal; Notable for the following components:   Potassium 6.1 (*)    Glucose, Bld 61 (*)    BUN 31 (*)    Creatinine, Ser 1.52 (*)    Albumin 3.4 (*)    GFR, Estimated 37 (*)    All other components within normal limits  CBC WITH DIFFERENTIAL/PLATELET - Abnormal; Notable for the following components:   RDW 17.9 (*)    All other components within normal limits  TROPONIN I (HIGH SENSITIVITY) - Abnormal; Notable for the following components:   Troponin I (High Sensitivity) 48 (*)    All other components within normal limits  TROPONIN I (HIGH SENSITIVITY) - Abnormal; Notable for the following components:   Troponin I (High Sensitivity) 77 (*)    All other components within normal limits  RESP PANEL BY RT-PCR (FLU A&B, COVID) ARPGX2  CBC WITH DIFFERENTIAL/PLATELET  COMPREHENSIVE METABOLIC PANEL  MAGNESIUM  MAGNESIUM  TROPONIN I (HIGH SENSITIVITY)    EKG EKG Interpretation  Date/Time:  Wednesday November 04 2021 21:57:45 EDT Ventricular Rate:  87 PR Interval:  172 QRS Duration: 90 QT Interval:  378 QTC Calculation: 455 R Axis:   266 Text Interpretation: Sinus rhythm Left anterior fascicular block Lateral infarct, old Anteroseptal infarct, old Confirmed by Sherwood Gambler [id number]) on 11/21/2021 10:16:55 PM  Radiology DG Chest 2 View  Result Date: 10/31/2021 CLINICAL DATA:  Shortness of breath. EXAM: CHEST - 2 VIEW COMPARISON:  Chest x-ray 02/13/2013 FINDINGS: The heart is enlarged. There are central interstitial markings bilaterally with small pleural effusions. There are patchy opacities in the lung bases. No pneumothorax or acute fracture. IMPRESSION: 1.  Cardiomegaly with mild interstitial edema pattern. 2. Small pleural effusions with bibasilar atelectasis/airspace disease.  Electronically Signed   By: Ronney Asters M.D.   On: 11/08/2021 16:12    Procedures Procedures    Medications Ordered in ED Medications  albuterol (VENTOLIN HFA) 108 (90 Base) MCG/ACT inhaler 2 puff (2 puffs Inhalation Given 11/21/2021 2016)  furosemide (LASIX) injection 20 mg (has no administration in time range)  acetaminophen (TYLENOL) tablet 650 mg (has no administration in time range)    Or  acetaminophen (TYLENOL) suppository 650 mg (has no administration in time range)  aspirin chewable tablet 324 mg (324 mg Oral Given 11/12/2021 2134)    ED Course/ Medical Decision Making/ A&P                           Medical Decision Making Amount and/or Complexity of  Data Reviewed External Data Reviewed: radiology and notes.    Details: Echo from 2014 showed EF 50-55% Labs: ordered.    Details: Troponins elevated 48-77.  Mild hyperkalemia.  BNP 594 Radiology: independent interpretation performed.    Details: Pleural effusions and mild edema with cardiomegaly. ECG/medicine tests: ordered and independent interpretation performed.    Details: No ST elevations.  Risk OTC drugs. Prescription drug management. Decision regarding hospitalization.   Patient presents with progressively worsening symptoms that sound like CHF.  She does have some mild hyperkalemia but no overt EKG changes.  This should improve with holding lisinopril and giving her diuresis.  She has soft but normal blood pressures.  Her original blood pressures which were below 90 seem to be abnormal values as it does not seem like her blood pressure cuff was appropriate.  Otherwise, I think she will need diuresis and admission.  While her troponins did go up, my suspicion is this is still CHF rather than ACS.  There is no chest pain.  I have a pretty low suspicion for PE.  Discussed with Dr. Arlean Hopping for admission.        Final Clinical Impression(s) / ED Diagnoses Final diagnoses:  Acute congestive heart failure, unspecified  heart failure type [facility])    Rx / DC Orders ED Discharge Orders     None         Pricilla Loveless, MD 11/01/2021 2217

## 2021-11-04 NOTE — H&P (Addendum)
History and Physical    PLEASE NOTE THAT DRAGON DICTATION SOFTWARE WAS USED IN THE CONSTRUCTION OF THIS NOTE.   Marie Stone [id number] DOB: [birth year] DOA: 10/27/2021  PCP: Nicholes Rough, PA-C  Patient coming from: home   I have personally briefly reviewed patient's old medical records in [place]  Chief Complaint: Shortness of breath  HPI: Marie Stone is a 71 y.o. female with medical history significant for type 2 diabetes mellitus complicated by diabetic peripheral polyneuropathy, chronic tobacco abuse, who is admitted to [facility] on 11/15/2021 with suspected new diagnosis of acute heart failure with preserved EF after presenting from home to [facility] ED complaining of shortness of breath.   The patient reports 1 week of progressive shortness of breath associate with orthopnea, worsening of edema in the right lower extremity as well as new onset edema in the left lower extremity.  She first noted edema in the right lower extremity to 3 months ago, prompting outpatient venous Doppler of the right lower extremity on 09/16/2021, which was negative for DVT.  Over the last week, she has noted progression of the edema in the right lower extremity.  She also notes a 40 pound weight gain over the last 2 months, is monitored via her home scale.   No associated any recent chest pain, diaphoresis, palpitations, nausea, vomiting, presyncope, or syncope.  Over the last week, she notes new onset nonproductive cough, in the absence of any wheezing, mopped assist, new lower extremity erythema, or calf tenderness.  No recent trauma, travel, surgical procedures, or periods of prolonged diminished ambulatory status. No recent melena or hematochezia. Denies any associated subjective fever, chills, rigors, or generalized myalgias.   Denies any known history of underlying CHF, and confirms that she is not on a diuretic medications at home.  Per chart review, most recent echocardiogram occurred in  October 2014, it was notable for LVEF 55%, no focal wall motion normalities, normal-appearing left ventricular cavity size, mildly dilated left atrium, mild tricuspid regurgitation and normal right ventricular systolic function.  Denies any history of regular or recent alcohol consumption, nor any use of recreational drugs, including no use of stimulants.  She conveys that she is a long-term smoker, smoking approximately 3/4 pack/day for approximately 20 years.  Denies any known history of underlying COPD, and also denies any known baseline supplemental oxygen requirements.     ED Course:  Vital signs in the ED were notable for the following: Afebrile; heart rate 81-98; blood pressure 102/53 - 119/53; respiratory rate 18-24, initial oxygen saturation 87% on room air, subsequent proving into the range of 95 to 97% on 3 L nasal cannula.  Labs were notable for the following: CMP notable for the following: Potassium 6.1, without any noted associated hemolysis, creatinine 1.52 compared most recent prior serum creatinine data point of 0.82 in April 2018, glucose 61, calcium, adjusted for mild hypoalbuminemia, 9.5, albumin 3.4, otherwise, liver enzymes within normal limits.  BNP 594, without any prior BNP data points available for point comparison.  High-sensitivity troponin I initially noted to be 48, with repeat value trending up to 77, without any prior troponin to the point available for point comparison.  CBC notable for white cell count 7000.  COVID-19/fluids PCR ordered, however patient has refused this.  Imaging and additional notable ED work-up: EKG, without any prior available for comparison shows sinus rhythm with heart rate 88, left anterior fascicular block, nonspecific T wave inversion in aVL, no evidence of ST changes, including  no evidence of ST elevation, will demonstrating the appearance of Q waves in leads V1/V2.  2 view chest x-ray shows cardiomegaly with interstitial edema, small  bilateral pleural effusions, bibasilar airspace opacity favoring atelectasis relative to infiltrate, demonstrating no evidence of pneumothorax.  While in the ED, the following were administered: Albuterol inhaler x2 puffs, full dose aspirin x1, Lasix 20 mg IV x1 at 2200.  Subsequently, the patient was admitted for further evaluation management of suspected new diagnosis of acute heart failure with preserved EF complicated by acute hypoxic respiratory distress, with presenting labs also notable for acute kidney injury, hyperkalemia, hypoglycemia, and mildly elevated troponin.    Review of Systems: As per HPI otherwise 10 point review of systems negative.   Past Medical History:  Diagnosis Date   Diabetes mellitus without complication (HCC)     Past Surgical History:  Procedure Laterality Date   LEG SURGERY     TUBAL LIGATION      Social History:  reports that she has been smoking cigarettes. She has been smoking an average of .75 packs per day. She has never used smokeless tobacco. She reports that she does not drink alcohol and does not use drugs.   Allergies  Allergen Reactions   Metformin And Related Diarrhea    Family History  Problem Relation Age of Onset   Diabetes Mother    Breast cancer Maternal Aunt    Breast cancer Maternal Grandmother     Family history reviewed and not pertinent    Prior to Admission medications   Medication Sig Start Date End Date Taking? Authorizing Provider  atorvastatin (LIPITOR) 40 MG tablet Take 40 mg by mouth daily. 02/12/21  Yes [provider]  etodolac (LODINE) 400 MG tablet Take 400 mg by mouth 2 (two) times daily. 11/02/21  Yes [provider]  gabapentin (NEURONTIN) 300 MG capsule Take 300 mg by mouth 3 (three) times daily. 12/16/20  Yes [provider]  glimepiride (AMARYL) 2 MG tablet Take 2 mg by mouth 2 (two) times daily. 04/16/18  Yes [provider]  JARDIANCE 25 MG TABS tablet Take 25 mg by  mouth daily. 05/19/18  Yes [provider]  lisinopril (ZESTRIL) 10 MG tablet Take 1 tablet by mouth daily. 12/16/20  Yes [provider]  meloxicam (MOBIC) 15 MG tablet TAKE 1 TABLET (15 MG TOTAL) BY MOUTH DAILY. 06/08/21  Yes Felecia Shelling, DPM  pioglitazone (ACTOS) 30 MG tablet Take 30 mg by mouth daily. 10/06/21  Yes [provider]  sitaGLIPtin (JANUVIA) 100 MG tablet Take 100 mg by mouth daily.   Yes [provider]  Blood Glucose Monitoring Suppl DEVI Use as directed twice daily. Dispense meter best covered by insurance 07/25/14   [provider]  doxycycline (VIBRA-TABS) 100 MG tablet Take 1 tablet (100 mg total) by mouth 2 (two) times daily. Patient not taking: Reported on 11/03/2021 02/04/21   Felecia Shelling, DPM  gentamicin cream (GARAMYCIN) 0.1 % Apply 1 application topically 2 (two) times daily. Patient not taking: Reported on 10/29/2021 02/04/21   Felecia Shelling, DPM  Glucose Blood (BLOOD GLUCOSE TEST STRIPS) STRP Use to check blood sugar 2 times per day. 07/21/16   Romero Belling, MD  glucose blood [facility] VERIO) test strip 1 each by Other route 2 (two) times daily. And lancets 2/day 07/19/16   Romero Belling, MD  Lancets MISC Use to check blood sugar 2 times per day. 07/22/16   Romero Belling, MD  Objective    Physical Exam: Vitals:   11/22/2021 2245 10/28/2021 2247 10/27/2021 2250 11/12/2021 2254  BP: (!) 115/50     Pulse: 85 88 89 85  Resp: 20 19 (!) 23 (!) 32  Temp:      SpO2: 96% 97% 96% 95%  Weight:      Height:        General: appears to be stated age; alert, oriented; mildly increased work of breathing noted Skin: warm, dry, no rash Head:  AT/[place] Mouth:  Oral mucosa membranes appear moist, normal dentition Neck: supple; trachea midline Heart:  RRR; did not appreciate any M/R/G Lungs: Bibasilar crackles noted, otherwise, CTAB, did not appreciate any wheezes, or rhonchi Abdomen: + BS; soft, ND, NT Vascular: 2+ pedal pulses b/l;  2+ radial pulses b/l Extremities: 2+ edema in b/l LE's, no muscle wasting Neuro: strength and sensation intact in upper and lower extremities b/l    Labs on Admission: I have personally reviewed following labs and imaging studies  CBC: Recent Labs  Lab 11/13/2021 1525  WBC 7.0  NEUTROABS 5.5  HGB 12.3  HCT 38.2  MCV 92.5  PLT 123XX123   Basic Metabolic Panel: Recent Labs  Lab 11/09/2021 1525  NA 138  K 6.1*  CL 104  CO2 25  GLUCOSE 61*  BUN 31*  CREATININE 1.52*  CALCIUM 9.1   GFR: Estimated Creatinine Clearance: 37.3 mL/min (A) (by C-G formula based on SCr of 1.52 mg/dL (H)). Liver Function Tests: Recent Labs  Lab 11/19/2021 1525  AST 26  ALT 12  ALKPHOS 87  BILITOT 0.7  PROT 7.5  ALBUMIN 3.4*   No results for input(s): "LIPASE", "AMYLASE" in the last 168 hours. No results for input(s): "AMMONIA" in the last 168 hours. Coagulation Profile: No results for input(s): "INR", "PROTIME" in the last 168 hours. Cardiac Enzymes: No results for input(s): "CKTOTAL", "CKMB", "CKMBINDEX", "TROPONINI" in the last 168 hours. BNP (last 3 results) No results for input(s): "PROBNP" in the last 8760 hours. HbA1C: No results for input(s): "HGBA1C" in the last 72 hours. CBG: No results for input(s): "GLUCAP" in the last 168 hours. Lipid Profile: No results for input(s): "CHOL", "HDL", "LDLCALC", "TRIG", "CHOLHDL", "LDLDIRECT" in the last 72 hours. Thyroid Function Tests: No results for input(s): "TSH", "T4TOTAL", "FREET4", "T3FREE", "THYROIDAB" in the last 72 hours. Anemia Panel: No results for input(s): "VITAMINB12", "FOLATE", "FERRITIN", "TIBC", "IRON", "RETICCTPCT" in the last 72 hours. Urine analysis:    Component Value Date/Time   COLORURINE YELLOW 02/13/2013 2246   APPEARANCEUR CLEAR 02/13/2013 2246   LABSPEC 1.022 02/13/2013 2246   PHURINE 5.0 02/13/2013 2246   GLUCOSEU >1000 (A) 02/13/2013 2246   HGBUR TRACE (A) 02/13/2013 2246   BILIRUBINUR NEGATIVE 02/13/2013 2246    KETONESUR NEGATIVE 02/13/2013 2246   PROTEINUR NEGATIVE 02/13/2013 2246   UROBILINOGEN 0.2 02/13/2013 2246   NITRITE NEGATIVE 02/13/2013 2246   LEUKOCYTESUR NEGATIVE 02/13/2013 2246    Radiological Exams on Admission: DG Chest 2 View  Result Date: 11/09/2021 CLINICAL DATA:  Shortness of breath. EXAM: CHEST - 2 VIEW COMPARISON:  Chest x-ray 02/13/2013 FINDINGS: The heart is enlarged. There are central interstitial markings bilaterally with small pleural effusions. There are patchy opacities in the lung bases. No pneumothorax or acute fracture. IMPRESSION: 1.  Cardiomegaly with mild interstitial edema pattern. 2. Small pleural effusions with bibasilar atelectasis/airspace disease. Electronically Signed   By: Ronney Asters M.D.   On: 11/18/2021 16:12     EKG: Independently reviewed, with result as  described above.    Assessment/Plan   Principal Problem:   Acute diastolic heart failure (HCC) Active Problems:   DM2 (diabetes mellitus, type 2) (HCC)   Tobacco abuse   AKI (acute kidney injury) (HCC)   Acute respiratory failure with hypoxia (HCC)   Elevated troponin   Hyperkalemia   Hypoglycemia       #) Acute heart failure with preserved EF: Appears to be a new diagnosis for patient, in setting of no known history of underlying heart failure, with presenting progressive shortness of breath, worsening of edema in the bilateral lower extremities, weight gain, elevated BNP, and chest x-ray showing evidence of interstitial edema as well as small bilateral pleural effusions.  Relative to this recent prior echocardiogram in October 2014, notable for LVEF 50 to 55%, with additional details as conveyed above.  Will this appears to be a new diagnosis for the patient, the timeframe of onset may be more subacute in nature,, and progressive worsening of peripheral edema over the last few months, for which timeframe she also notes a 40 pound weight gain.  Etiology not entirely clear at this time,  with ACS.  Less likely in the absence of any recent chest pain, while EKG demonstrates no evidence of overt acute ischemic changes.  Potential pharmacologic contributions previous visit the patient to fluid retention include scheduled use of outpatient NSAIDs.  Of note, she is status post 20 mg of IV Lasix administered around 2200 in the ED this evening.   Plan: Lasix 20 mg IV twice daily.  Monitor on telemetry.  Add on serum magnesium level.  Echocardiogram in the morning.  Trend troponin.  Wall out on serum ethanol level as well as urinary drug screen.  Repeat CMP in the morning.  In the setting of concomitant acute kidney injury, will hold home lisinopril for now.  Hold home etodolac as well as Mobic.         #) Acute Kidney Injury: Presenting serum creatinine level noted to be 1.52 compared to most recent prior value of 0.82 in April 2018.  Appears prerenal in nature, suspected to be on the basis of diminished renal perfusion gradient as consequence of presenting acutely decompensated heart failure, as above.  Potential pharmacologic exacerbating factors include outpatient lisinopril as well as scheduled NSAIDs.  We will hold these home occasions, while pursuing IV diuresis, as further detailed above.   Plan: monitor strict I's & O's and daily weights. Attempt to avoid nephrotoxic agents.  Hold home lisinopril, etodolac, Mobic.  Repeat CMP in the morning. Check serum magnesium level.  Check urinalysis with microscopy add-on random urine sodium and random urine creatinine.  IV diuresis via Lasix, as above. if renal function does not improve with the above measures, can consider obtaining a renal US to evaluate for parenchymal abnormality as well as to assess for evidence of post-renal obstructive process.  Hold home glimepiride for now.         #) Hyperkalemia: Presenting serum potassium level noted to be 6.1, without any associated hemolysis noted.  Likely multifactorial in nature, with  contributions from AKI as well as potential for multiple pharmacologic contributions including outpatient Cipro as well as scheduled NSAIDs.  No evidence of associated EKG changes, including no evidence of peaked T waves.  Will closely monitor ensuing serum potassium trend with interval IV diuresis efforts, as well as additional pharmacologic intervention, as noted below.  Plan: Monitor on telemetry.  IV diuresis, as above.  Lokelma 10 g p.o. x1  now.  Add-on magnesium level.  Calcium gluconate 1 g IV over 1 hour x 1 dose now.  Repeat BMP at 1 AM.  Repeat CMP in the morning.  Further evaluation and treatment of AKI, as above.  Hold home lisinopril, as well as home NSAIDs.        #) Acute hypoxic respiratory distress: in the context of acute respiratory symptoms and no known baseline supplemental O2 requirements, presenting O2 sat noted to be in the high 80s on room air, subsequent proving into the mid 90s on 3 L nasal cannula, thereby meeting criteria for acute hypoxic respiratory distress as opposed to acute hypoxic respiratory failure at this time. Appears to be on basis of new diagnosis of acute heart failure with preserved EF. Presenting CXR shows he has findings consistent with decompensated heart failure, without overt evidence of infiltrate to suggest pneumonia, no evidence of pneumothorax.  Of note, bibasilar airspace opacity observed on chest x-ray, appears to favor atelectasis relative to infiltrate.  We will add on procalcitonin level to further assess.  No known chronic underlying pulmonary conditions, although it is possible that the patient may have previously undiagnosed COPD given the longevity of her tobacco abuse.  In terms of other considered etiologies, ACS appears less likely at this time in the absence of any recent CP  presenting EKG showing no e/o acute ischemic process. Clinically, presentation is less suggestive of acute PE at this time. COVID-19/Influenza PCR have been ordered,  although the patient has refused this evaluation at the present.    Plan: further evaluation/management of presenting new diagnosis of acute heart failure with preserved EF, plan for IV diuresis, as above. Monitor continuous pulse ox with prn supplemental O2 to maintain O2 sats greater than or equal to 92%. monitor on telemetry. CMP/CBC in the AM. Check serum Mg and Phos levels. Check blood gas. incentive spirometry.  Add on procalcitonin level.  Continue to trend troponin.  Echo in the morning.          #) Elevated troponin: mildly elevated initial troponin of 48, with repeat value trending up slightly to 77. No prior high sensitivity troponin I value available for point of comparison.  Suspect that this mildly elevated troponin is on the basis of supply demand mismatch as a result of presenting acutely decompensated heart failure as well as contribution from diminished oxygen delivery capacity due to acute hypoxic respiratory distress as opposed to representing a type I process due to acute plaque rupture. Additionally, relative decline in renal clearance of troponin in the setting of AKI is likely also contributing to presenting troponin elevation. EKG shows no evidence of acute ischemic changes, including no evidence of STEMI. Additionally, presentation is not associated with any CP.  Overall, ACS is felt to be less likely relative to type 2 supply demand mismatch, as above, but will closely monitor on telemetry overnight while treating suspected underlying acute decompensated heart failure and associated acute hypoxic respiratory distress, as further described above.   Full dose aspirin administered in the ED this evening.   Plan: Trend troponin. Monitor on telemetry. PRN EKG for development of chest pain.  Check serum Mg level and repeat CMP in the morning.  Repeat CBC in the AM. Additional evaluation and management of presenting new diagnosis of acute heart failure with preserved EF as well  as acute hypoxic respiratory stress as suspected driving force behind mildly elevated troponin, as above.  Echo in the morning.  Continue outpatient high intensity atorvastatin.  Hold home lisinopril in the setting of AKI as well as hyperkalemia.  Refraining from initiation of beta-blocker in the setting of presenting acutely decompensated heart failure in the absence of outpatient beta-blocker use.        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  Contributing factors include multiple outpatient oral hypoglycemic agents.  Not on any insulin at home.  Historically, it appears the patient's diabetes is poorly controlled, with most recent hemoglobin A1c noted to be 11.4% in January 2020.   Plan: Repeat CBG now.  Prn amp of D50 for CBG less than 70.  Add on hemoglobin A1c level.  Hold home Januvia, Actos, Jardiance, and glimepiride.  Ensuing CBG monitoring before every meal and at bedtime with very low-dose sliding scale insulin in the absence of nightly sliding scale coverage.  In setting of AKI, will hold gabapentin for now.          #) Chronic tobacco abuse: Patient conveys that they are a current smoker, having smoked 0.75 ppd for greater than 20 years.   Plan: Counseled the patient for less than 2 minutes on the importance of complete smoking discontinuation.  Order placed for prn nicotine patch for use during this hospitalization.     DVT prophylaxis: SCD's   Code Status: Full code Family Communication: none Disposition Plan: Per Rounding Team Consults called: none;  Admission status: Inpatient    PLEASE NOTE THAT DRAGON DICTATION SOFTWARE WAS USED IN THE CONSTRUCTION OF THIS NOTE.   Bells DO [facility]  From [place]   10/31/2021, 11:01 PM

## 2021-11-04 NOTE — ED Provider Triage Note (Signed)
Emergency Medicine Provider Triage Evaluation Note  Marie Stone , a 71 y.o. female  was evaluated in triage.  Pt complains of shortness of breath and bilateral leg swelling.  Patient reports that she has chronic swelling to her lower limbs however has been worse over the last 3 days.  Patient reports that over the last 3 days she has been having shortness of breath.  Shortness of breath is worse with ambulation.  Patient reports that she has not had a 20 pound weight gain.  Patient endorses nonproductive cough however states that this is at her baseline.  Review of Systems  Positive: shortness of breath, leg swelling, weight gain, cough Negative: Chest pain, palpitations, lightheadedness, syncope, fever, chills  Physical Exam  BP 109/71 (BP Location: Right Arm)   Pulse 98   Temp 98.6 F (37 C)   Resp (!) 24   Ht 5\' 4"  (1.626 m)   Wt 89.4 kg   SpO2 (!) 87% Comment: placed on 2L [place]  BMI 33.81 kg/m  Gen:   Awake, no distress   Resp:  Normal effort, minimal expiratory wheezing noted to all lung fields MSK:   Moves extremities without difficulty, edema to bilateral lower extremities Other:    Medical Decision Making  Medically screening exam initiated at 3:22 PM.  Appropriate orders placed.  Saja Bartolini was informed that the remainder of the evaluation will be completed by another provider, this initial triage assessment does not replace that evaluation, and the importance of remaining in the ED until their evaluation is complete.  Oxygen saturation 87% on room air.  Patient does not wear any home oxygen.  Placed and placed on 2 LPM of O2 via nasal cannula with improvement in SPO2.  Will order albuterol inhaler for patient.  Will work-up for ACS, CHF exacerbation, pneumonia, COVID-19/influenza.   Leonie Douglas, Haskel Schroeder 11/06/2021 1533

## 2021-11-04 NOTE — ED Notes (Signed)
Patient refuses covid swab despite best efforts to educate

## 2021-11-04 NOTE — ED Triage Notes (Signed)
Increased SOB wheezing and leg swelling x 3 days.  Oxygen was 88% on RA at PCP and sent by EMS.  Placed on 2L [place]

## 2021-11-05 ENCOUNTER — Other Ambulatory Visit (HOSPITAL_COMMUNITY): Payer: Medicare Other

## 2021-11-05 ENCOUNTER — Inpatient Hospital Stay (HOSPITAL_COMMUNITY): Payer: Medicare Other

## 2021-11-05 DIAGNOSIS — Z720 Tobacco use: Secondary | ICD-10-CM | POA: Diagnosis not present

## 2021-11-05 DIAGNOSIS — E669 Obesity, unspecified: Secondary | ICD-10-CM

## 2021-11-05 DIAGNOSIS — N179 Acute kidney failure, unspecified: Secondary | ICD-10-CM | POA: Diagnosis not present

## 2021-11-05 DIAGNOSIS — E1169 Type 2 diabetes mellitus with other specified complication: Secondary | ICD-10-CM | POA: Diagnosis not present

## 2021-11-05 DIAGNOSIS — I5031 Acute diastolic (congestive) heart failure: Secondary | ICD-10-CM

## 2021-11-05 DIAGNOSIS — E785 Hyperlipidemia, unspecified: Secondary | ICD-10-CM

## 2021-11-05 LAB — I-STAT VENOUS BLOOD GAS, ED
Acid-Base Excess: 0 mmol/L (ref 0.0–2.0)
Bicarbonate: 25.8 mmol/L (ref 20.0–28.0)
Calcium, Ion: 1.19 mmol/L (ref 1.15–1.40)
HCT: 34 % — ABNORMAL LOW (ref 36.0–46.0)
Hemoglobin: 11.6 g/dL — ABNORMAL LOW (ref 12.0–15.0)
O2 Saturation: 69 %
Potassium: 5.5 mmol/L — ABNORMAL HIGH (ref 3.5–5.1)
Sodium: 139 mmol/L (ref 135–145)
TCO2: 27 mmol/L (ref 22–32)
pCO2, Ven: 45.6 mmHg (ref 44–60)
pH, Ven: 7.361 (ref 7.25–7.43)
pO2, Ven: 38 mmHg (ref 32–45)

## 2021-11-05 LAB — URINALYSIS, COMPLETE (UACMP) WITH MICROSCOPIC
Bilirubin Urine: NEGATIVE
Glucose, UA: 50 mg/dL — AB
Hgb urine dipstick: NEGATIVE
Ketones, ur: NEGATIVE mg/dL
Nitrite: NEGATIVE
Protein, ur: NEGATIVE mg/dL
Specific Gravity, Urine: 1.013 (ref 1.005–1.030)
pH: 5 (ref 5.0–8.0)

## 2021-11-05 LAB — RAPID URINE DRUG SCREEN, HOSP PERFORMED
Amphetamines: NOT DETECTED
Barbiturates: NOT DETECTED
Benzodiazepines: NOT DETECTED
Cocaine: NOT DETECTED
Opiates: NOT DETECTED
Tetrahydrocannabinol: NOT DETECTED

## 2021-11-05 LAB — RESP PANEL BY RT-PCR (FLU A&B, COVID) ARPGX2
Influenza A by PCR: NEGATIVE
Influenza B by PCR: NEGATIVE
SARS Coronavirus 2 by RT PCR: NEGATIVE

## 2021-11-05 LAB — CBG MONITORING, ED
Glucose-Capillary: 68 mg/dL — ABNORMAL LOW (ref 70–99)
Glucose-Capillary: 101 mg/dL — ABNORMAL HIGH (ref 70–99)
Glucose-Capillary: 65 mg/dL — ABNORMAL LOW (ref 70–99)
Glucose-Capillary: 105 mg/dL — ABNORMAL HIGH (ref 70–99)

## 2021-11-05 LAB — CBC WITH DIFFERENTIAL/PLATELET
Abs Immature Granulocytes: 0.03 10*3/uL (ref 0.00–0.07)
Basophils Absolute: 0 10*3/uL (ref 0.0–0.1)
Basophils Relative: 1 %
Eosinophils Absolute: 0.1 10*3/uL (ref 0.0–0.5)
Eosinophils Relative: 2 %
HCT: 34.2 % — ABNORMAL LOW (ref 36.0–46.0)
Hemoglobin: 10.8 g/dL — ABNORMAL LOW (ref 12.0–15.0)
Immature Granulocytes: 1 %
Lymphocytes Relative: 15 %
Lymphs Abs: 0.9 10*3/uL (ref 0.7–4.0)
MCH: 29.4 pg (ref 26.0–34.0)
MCHC: 31.6 g/dL (ref 30.0–36.0)
MCV: 93.2 fL (ref 80.0–100.0)
Monocytes Absolute: 0.4 10*3/uL (ref 0.1–1.0)
Monocytes Relative: 6 %
Neutro Abs: 4.9 10*3/uL (ref 1.7–7.7)
Neutrophils Relative %: 75 %
Platelets: 158 10*3/uL (ref 150–400)
RBC: 3.67 MIL/uL — ABNORMAL LOW (ref 3.87–5.11)
RDW: 17.7 % — ABNORMAL HIGH (ref 11.5–15.5)
WBC: 6.5 10*3/uL (ref 4.0–10.5)
nRBC: 0 % (ref 0.0–0.2)

## 2021-11-05 LAB — COMPREHENSIVE METABOLIC PANEL
ALT: 12 U/L (ref 0–44)
AST: 20 U/L (ref 15–41)
Albumin: 3 g/dL — ABNORMAL LOW (ref 3.5–5.0)
Alkaline Phosphatase: 74 U/L (ref 38–126)
Anion gap: 7 (ref 5–15)
BUN: 31 mg/dL — ABNORMAL HIGH (ref 8–23)
CO2: 27 mmol/L (ref 22–32)
Calcium: 8.8 mg/dL — ABNORMAL LOW (ref 8.9–10.3)
Chloride: 105 mmol/L (ref 98–111)
Creatinine, Ser: 1.7 mg/dL — ABNORMAL HIGH (ref 0.44–1.00)
GFR, Estimated: 32 mL/min — ABNORMAL LOW (ref >60)
Glucose, Bld: 108 mg/dL — ABNORMAL HIGH (ref 70–99)
Potassium: 5.3 mmol/L — ABNORMAL HIGH (ref 3.5–5.1)
Sodium: 139 mmol/L (ref 135–145)
Total Bilirubin: 0.5 mg/dL (ref 0.3–1.2)
Total Protein: 6.5 g/dL (ref 6.5–8.1)

## 2021-11-05 LAB — ECHOCARDIOGRAM COMPLETE
Area-P 1/2: 5.38 cm2
Calc EF: 25.1 %
Height: 64 in
S' Lateral: 4.1 cm
Single Plane A2C EF: 21.6 %
Single Plane A4C EF: 26 %
Weight: 3152 oz

## 2021-11-05 LAB — MAGNESIUM: Magnesium: 2.4 mg/dL (ref 1.7–2.4)

## 2021-11-05 LAB — GLUCOSE, CAPILLARY: Glucose-Capillary: 112 mg/dL — ABNORMAL HIGH (ref 70–99)

## 2021-11-05 LAB — CREATININE, URINE, RANDOM: Creatinine, Urine: 113 mg/dL

## 2021-11-05 LAB — PHOSPHORUS: Phosphorus: 5.2 mg/dL — ABNORMAL HIGH (ref 2.5–4.6)

## 2021-11-05 LAB — TROPONIN I (HIGH SENSITIVITY): Troponin I (High Sensitivity): 182 ng/L (ref <18)

## 2021-11-05 LAB — SODIUM, URINE, RANDOM: Sodium, Ur: 36 mmol/L

## 2021-11-05 MED ORDER — ORAL CARE MOUTH RINSE: 15.0000 mL | OROMUCOSAL | Status: DC | PRN

## 2021-11-05 MED ORDER — GABAPENTIN 300 MG PO CAPS
300.0000 mg | ORAL_CAPSULE | Freq: Three times a day (TID) | ORAL | Status: DC
  Administered 2021-11-05 – 2021-11-07 (×4): 300 mg via ORAL
  Filled 2021-11-05 (×4): qty 1

## 2021-11-05 MED ORDER — FUROSEMIDE 10 MG/ML IJ SOLN
40.0000 mg | Freq: Two times a day (BID) | INTRAMUSCULAR | Status: DC
  Administered 2021-11-06: 40 mg via INTRAVENOUS
  Filled 2021-11-05: qty 4

## 2021-11-05 MED ORDER — PERFLUTREN LIPID MICROSPHERE
1.0000 mL | INTRAVENOUS | Status: AC | PRN
  Administered 2021-11-05: 2 mL via INTRAVENOUS

## 2021-11-05 MED ORDER — ALBUTEROL SULFATE HFA 108 (90 BASE) MCG/ACT IN AERS: 2.0000 | INHALATION_SPRAY | Freq: Four times a day (QID) | RESPIRATORY_TRACT | Status: DC | PRN

## 2021-11-05 NOTE — ED Notes (Signed)
Pt CBG was 65. Apple juice and crackers were provided and the pt was instructed to consume them.

## 2021-11-05 NOTE — Assessment & Plan Note (Deleted)
Calculated BMI is 37.4  

## 2021-11-05 NOTE — Assessment & Plan Note (Deleted)
Smoking cessation counseling 

## 2021-11-05 NOTE — Progress Notes (Signed)
  Progress Note   Patient: Marie Stone GLO:[id number] DOB: 1950/07/20 DOA: Nov 27, 2021     1 DOS: the patient was seen and examined on 11/05/2021   Brief hospital course: [name] was admitted to the hospital with the working diagnosis of heart failure.  71 yo female with the past medical history of T2DM and obesity class 2 who presented with dyspnea. Reported 1 week of progressive dyspnea, orthopnea, and lower extremity edema. Positive 40 lbs weight gain over last 2 months. On her initial physical examination her blood pressure 119/53, HR 81-98, RR 18 to 24, 02 saturation 95 to 07% on 3 L per [place]. Lungs with bibasilar rales, with no wheezing, heart with S1 and S2 present and rhythmic with no gallops, abdomen not distended , positive lower extremity edema ++.   Na 138, K 6,1 CL 104 bicarbonate 25 glucose 61 bun 31 cr 1,52  BNP 594  Wbc 7,0 hgb 12,3 plt 199   Chest radiograph with cardiomegaly, bilateral hilar vascular congestion, bilateral interstitial infiltrates predominant at the lower lobes.   EKG 88 bpm, left axis with left anterior fascicular block, sinus rhythm with poor R wave progression, no significant ST segmentor T wave changes.    Assessment and Plan: * Acute diastolic heart failure (HCC) Patient continue symptomatic  Urine output not yet documented  Plan to continue decongestion with furosemide IV Follow on blood pressure and renal function. Pending echocardiogram result.   AKI (acute kidney injury) (HCC) Hyperkalemia,   Renal function with serum cr at 1,70 with K at 5,3 and serum bicarbonate at 27,   Plan to continue diuresis with furosemide and follow up renal function in am. Avoid hypotension and nephrotoxic medications.   DM2 (diabetes mellitus, type 2) (HCC) Fasting glucose this am 108,  Continue insulin sliding scale for glucose cover and monitoring.   Tobacco abuse Smoking cessation counseling.   Class 2 obesity Calculated BMI is 37.4          Subjective: Patient continue to with dyspnea, no chest pain, positive lower extremity edema   Physical Exam: Vitals:   11/05/21 1100 11/05/21 1330 11/05/21 1611 11/05/21 1645  BP: 125/70 127/60 116/62 105/61  Pulse: 87 93 88 88  Resp: (!) 21 (!) 22 20 18   Temp:   98.6 F (37 C) 98.9 F (37.2 C)  TempSrc:   Oral Oral  SpO2: 96% 96% 96% 91%  Weight:    102.1 kg  Height:    5\' 5"  (1.651 m)   Neurology awake and alert ENT With no pallor Cardiovascular with S1 and S2 present and rhythmic with no gallops, rubs or murmurs Respiratory with rales at bases and expiratory wheezing Abdomen not distended Positive lower extremity edema ++ pitting more right than left.  Data Reviewed:     Family Communication: I spoke with patient's husband at the bedside, we talked in detail about patient's condition, plan of care and prognosis and all questions were addressed.  Disposition: Status is: Inpatient Remains inpatient appropriate because: heart failure   Planned Discharge Destination: Home  Author: , MD 11/05/2021 6:03 PM  For on call review [email or web address].

## 2021-11-05 NOTE — Assessment & Plan Note (Deleted)
Echocardiogram with decreased LV systolic function, 20 to 25% with antero septal , anterior, inferior septal and apical akinesis. Mild dilatation of LV cavity, RV systolic function preserved, RVSP 36,2. No significant valvular disease.   Urine output is documented 300 cc Clinically volume status has improved but not back to baseline.   Plan to continue diuresis with furosemide 80 mg IV q12 hrs.  Continue empagliflozin  Added digoxin  Will need coronary angiography this admission.  Add aspirin and continue with statin therapy.   Acute respiratory failure due to acute cardiogenic pulmonary edema. Oxygenation today is 95% on 3 L/min per [place]

## 2021-11-05 NOTE — ED Notes (Signed)
ED TO INPATIENT HANDOFF REPORT   S Name/Age/Gender Marie Stone 70 y.o. female Room/Bed: 014C/014C  Code Status   Code Status: Full Code  Home/SNF/Other Home Patient oriented to: self, place, time, and situation Is this baseline? Yes   Triage Complete: Triage complete  Chief Complaint Acute diastolic heart failure ([place]) [I50.31]  Triage Note Increased SOB wheezing and leg swelling x 3 days.  Oxygen was 88% on RA at PCP and sent by EMS.  Placed on 2L [place]   Allergies Allergies  Allergen Reactions   Metformin And Related Diarrhea    Level of Care/Admitting Diagnosis ED Disposition     ED Disposition  Admit   Condition  --   Comment  Hospital Area: [facility] [[id number]]  Level of Care: Progressive [102]  Admit to Progressive based on following criteria: MULTISYSTEM THREATS such as stable sepsis, metabolic/electrolyte imbalance with or without encephalopathy that is responding to early treatment.  May admit patient to [facility] or [facility] if equivalent level of care is available:: No  Covid Evaluation: Asymptomatic - no recent exposure (last 10 days) testing not required  Diagnosis: Acute diastolic heart failure ([place]) [428.31.ICD-9-CM]  Admitting Physician: Rhetta Mura [id number]  Attending Physician: Rhetta Mura [id number]  Certification:: I certify this patient will need inpatient services for at least 2 midnights  Estimated Length of Stay: 3          B Medical/Surgery History Past Medical History:  Diagnosis Date   Diabetes mellitus without complication ([place])    Past Surgical History:  Procedure Laterality Date   LEG SURGERY     TUBAL LIGATION       A IV Location/Drains/Wounds Patient Lines/Drains/Airways Status     Active Line/Drains/Airways     Name Placement date Placement time Site Days   Peripheral IV 11/17/2021 18 G Right Antecubital 10/29/2021  1523  Antecubital  1   PICC / Midline Single Lumen 02/19/13 PICC  Right Basilic 42 cm 0 cm 0000000  [id number]  Basilic  0000000   Incision 02/14/13 Leg Right;Medial 02/14/13  2016  -- 3186            Intake/Output Last 24 hours No intake or output data in the 24 hours ending 11/05/21 1350  Labs/Imaging Results for orders placed or performed during the hospital encounter of 11/22/2021 (from the past 48 hour(s))  Brain natriuretic peptide     Status: Abnormal   Collection Time: 11/17/2021  3:22 PM  Result Value Ref Range   B Natriuretic Peptide 594.2 (H) 0.0 - 100.0 pg/mL    Comment: Performed at [facility] [address] [place], [place] [address]  Troponin I (High Sensitivity)     Status: Abnormal   Collection Time: 11/14/2021  3:25 PM  Result Value Ref Range   Troponin I (High Sensitivity) 48 (H) <18 ng/L    Comment: (NOTE) Elevated high sensitivity troponin I (hsTnI) values and significant  changes across serial measurements may suggest ACS but many other  chronic and acute conditions are known to elevate hsTnI results.  Refer to the "Links" section for chest pain algorithms and additional  guidance. Performed at [facility], [place] [address], [place], [place] [address]   Comprehensive metabolic panel     Status: Abnormal   Collection Time: 11/22/2021  3:25 PM  Result Value Ref Range   Sodium 138 135 - 145 mmol/L   Potassium 6.1 (H) 3.5 - 5.1 mmol/L   Chloride  104 98 - 111 mmol/L   CO2 25 22 - 32 mmol/L   Glucose, Bld 61 (L) 70 - 99 mg/dL    Comment: Glucose reference range applies only to samples taken after fasting for at least 8 hours.   BUN 31 (H) 8 - 23 mg/dL   Creatinine, Ser 1.52 (H) 0.44 - 1.00 mg/dL   Calcium 9.1 8.9 - 10.3 mg/dL   Total Protein 7.5 6.5 - 8.1 g/dL   Albumin 3.4 (L) 3.5 - 5.0 g/dL   AST 26 15 - 41 U/L   ALT 12 0 - 44 U/L   Alkaline Phosphatase 87 38 - 126 U/L   Total Bilirubin 0.7 0.3 - 1.2 mg/dL   GFR, Estimated 37 (L) >60 mL/min    Comment: (NOTE) Calculated using the CKD-EPI Creatinine Equation  (2021)    Anion gap 9 5 - 15    Comment: Performed at [facility] [address], [place], [place] [address]  CBC with Differential     Status: Abnormal   Collection Time: 10/24/2021  3:25 PM  Result Value Ref Range   WBC 7.0 4.0 - 10.5 K/uL   RBC 4.13 3.87 - 5.11 MIL/uL   Hemoglobin 12.3 12.0 - 15.0 g/dL   HCT 38.2 36.0 - 46.0 %   MCV 92.5 80.0 - 100.0 fL   MCH 29.8 26.0 - 34.0 pg   MCHC 32.2 30.0 - 36.0 g/dL   RDW 17.9 (H) 11.5 - 15.5 %   Platelets 199 150 - 400 K/uL   nRBC 0.0 0.0 - 0.2 %   Neutrophils Relative % 77 %   Neutro Abs 5.5 1.7 - 7.7 K/uL   Lymphocytes Relative 12 %   Lymphs Abs 0.8 0.7 - 4.0 K/uL   Monocytes Relative 6 %   Monocytes Absolute 0.4 0.1 - 1.0 K/uL   Eosinophils Relative 3 %   Eosinophils Absolute 0.2 0.0 - 0.5 K/uL   Basophils Relative 1 %   Basophils Absolute 0.1 0.0 - 0.1 K/uL   Immature Granulocytes 1 %   Abs Immature Granulocytes 0.05 0.00 - 0.07 K/uL    Comment: Performed at [facility] [address], [place], [place] [address]  Troponin I (High Sensitivity)     Status: Abnormal   Collection Time: 11/19/2021  7:42 PM  Result Value Ref Range   Troponin I (High Sensitivity) 77 (H) <18 ng/L    Comment: RESULT CALLED TO, READ BACK BY AND VERIFIED WITH L.BENNETT,RN @2046  11/20/2021 [email or web address] (NOTE) Elevated high sensitivity troponin I (hsTnI) values and significant  changes across serial measurements may suggest ACS but many other  chronic and acute conditions are known to elevate hsTnI results.  Refer to the "Links" section for chest pain algorithms and additional  guidance. Performed at [facility], [place] [address] [place], [place] [address]   Troponin I (High Sensitivity)     Status: Abnormal   Collection Time: 11/15/2021 10:28 PM  Result Value Ref Range   Troponin I (High Sensitivity) 57 (H) <18 ng/L    Comment: (NOTE) Elevated high sensitivity troponin I (hsTnI) values and significant  changes across serial measurements  may suggest ACS but many other  chronic and acute conditions are known to elevate hsTnI results.  Refer to the "Links" section for chest pain algorithms and additional  guidance. Performed at [facility], [place] [address], [place], [place] [address]   Magnesium     Status: Abnormal  Collection Time: 10/31/2021 10:28 PM  Result Value Ref Range   Magnesium 1.3 (L) 1.7 - 2.4 mg/dL    Comment: Performed at [facility], [place] [address] [place], [place] [address]  CBC with Differential/Platelet     Status: Abnormal   Collection Time: 11/05/21  2:05 AM  Result Value Ref Range   WBC 6.5 4.0 - 10.5 K/uL   RBC 3.67 (L) 3.87 - 5.11 MIL/uL   Hemoglobin 10.8 (L) 12.0 - 15.0 g/dL   HCT 34.2 (L) 36.0 - 46.0 %   MCV 93.2 80.0 - 100.0 fL   MCH 29.4 26.0 - 34.0 pg   MCHC 31.6 30.0 - 36.0 g/dL   RDW 17.7 (H) 11.5 - 15.5 %   Platelets 158 150 - 400 K/uL   nRBC 0.0 0.0 - 0.2 %   Neutrophils Relative % 75 %   Neutro Abs 4.9 1.7 - 7.7 K/uL   Lymphocytes Relative 15 %   Lymphs Abs 0.9 0.7 - 4.0 K/uL   Monocytes Relative 6 %   Monocytes Absolute 0.4 0.1 - 1.0 K/uL   Eosinophils Relative 2 %   Eosinophils Absolute 0.1 0.0 - 0.5 K/uL   Basophils Relative 1 %   Basophils Absolute 0.0 0.0 - 0.1 K/uL   Immature Granulocytes 1 %   Abs Immature Granulocytes 0.03 0.00 - 0.07 K/uL    Comment: Performed at [facility] [address], [place], [place] [address]  Comprehensive metabolic panel     Status: Abnormal   Collection Time: 11/05/21  2:05 AM  Result Value Ref Range   Sodium 139 135 - 145 mmol/L   Potassium 5.3 (H) 3.5 - 5.1 mmol/L   Chloride 105 98 - 111 mmol/L   CO2 27 22 - 32 mmol/L   Glucose, Bld 108 (H) 70 - 99 mg/dL    Comment: Glucose reference range applies only to samples taken after fasting for at least 8 hours.   BUN 31 (H) 8 - 23 mg/dL   Creatinine, Ser 1.70 (H) 0.44 - 1.00 mg/dL   Calcium 8.8 (L) 8.9 - 10.3 mg/dL   Total Protein 6.5 6.5 - 8.1 g/dL   Albumin 3.0  (L) 3.5 - 5.0 g/dL   AST 20 15 - 41 U/L   ALT 12 0 - 44 U/L   Alkaline Phosphatase 74 38 - 126 U/L   Total Bilirubin 0.5 0.3 - 1.2 mg/dL   GFR, Estimated 32 (L) >60 mL/min    Comment: (NOTE) Calculated using the CKD-EPI Creatinine Equation (2021)    Anion gap 7 5 - 15    Comment: Performed at [facility], [place] [address], [place], [place] [address]  Magnesium     Status: None   Collection Time: 11/05/21  2:05 AM  Result Value Ref Range   Magnesium 2.4 1.7 - 2.4 mg/dL    Comment: Performed at [facility] [address], [place], [place] [address]  Phosphorus     Status: Abnormal   Collection Time: 11/05/21  2:05 AM  Result Value Ref Range   Phosphorus 5.2 (H) 2.5 - 4.6 mg/dL    Comment: Performed at [facility] [address] [place], [place] [address]  Resp Panel by RT-PCR (Flu A&B, Covid) Anterior Nasal Swab     Status: None   Collection Time: 11/05/21  6:00 AM   Specimen: Anterior Nasal Swab  Result Value Ref Range   SARS Coronavirus 2 by RT  PCR NEGATIVE NEGATIVE    Comment: (NOTE) SARS-CoV-2 target nucleic acids are NOT DETECTED.  The SARS-CoV-2 RNA is generally detectable in upper respiratory specimens during the acute phase of infection. The lowest concentration of SARS-CoV-2 viral copies this assay can detect is 138 copies/mL. A negative result does not preclude SARS-Cov-2 infection and should not be used as the sole basis for treatment or other patient management decisions. A negative result may occur with  improper specimen collection/handling, submission of specimen other than nasopharyngeal swab, presence of viral mutation(s) within the areas targeted by this assay, and inadequate number of viral copies(<138 copies/mL). A negative result must be combined with clinical observations, patient history, and epidemiological information. The expected result is Negative.  Fact Sheet for Patients:  BloggerCourse.com  Fact  Sheet for Healthcare Providers:  SeriousBroker.it  This test is no t yet approved or cleared by the [place] FDA and  has been authorized for detection and/or diagnosis of SARS-CoV-2 by FDA under an Emergency Use Authorization (EUA). This EUA will remain  in effect (meaning this test can be used) for the duration of the COVID-19 declaration under Section 564(b)(1) of the Act, 21 U.S.C.section 360bbb-3(b)(1), unless the authorization is terminated  or revoked sooner.       Influenza A by PCR NEGATIVE NEGATIVE   Influenza B by PCR NEGATIVE NEGATIVE    Comment: (NOTE) The Xpert Xpress SARS-CoV-2/FLU/RSV plus assay is intended as an aid in the diagnosis of influenza from Nasopharyngeal swab specimens and should not be used as a sole basis for treatment. Nasal washings and aspirates are unacceptable for Xpert Xpress SARS-CoV-2/FLU/RSV testing.  Fact Sheet for Patients: BloggerCourse.com  Fact Sheet for Healthcare Providers: SeriousBroker.it  This test is not yet approved or cleared by the [place] FDA and has been authorized for detection and/or diagnosis of SARS-CoV-2 by FDA under an Emergency Use Authorization (EUA). This EUA will remain in effect (meaning this test can be used) for the duration of the COVID-19 declaration under Section 564(b)(1) of the Act, 21 U.S.C. section 360bbb-3(b)(1), unless the authorization is terminated or revoked.  Performed at [facility], [address] [place], [place] [address]   I-Stat venous blood gas, ED     Status: Abnormal   Collection Time: 11/05/21  6:11 AM  Result Value Ref Range   pH, Ven 7.361 7.25 - 7.43   pCO2, Ven 45.6 44 - 60 mmHg   pO2, Ven 38 32 - 45 mmHg   Bicarbonate 25.8 20.0 - 28.0 mmol/L   TCO2 27 22 - 32 mmol/L   O2 Saturation 69 %   Acid-Base Excess 0.0 0.0 - 2.0 mmol/L   Sodium 139 135 - 145 mmol/L   Potassium 5.5 (H) 3.5 -  5.1 mmol/L   Calcium, Ion 1.19 1.15 - 1.40 mmol/L   HCT 34.0 (L) 36.0 - 46.0 %   Hemoglobin 11.6 (L) 12.0 - 15.0 g/dL   Sample type VENOUS    Comment NOTIFIED PHYSICIAN   CBG monitoring, ED     Status: Abnormal   Collection Time: 11/05/21  6:16 AM  Result Value Ref Range   Glucose-Capillary 68 (L) 70 - 99 mg/dL    Comment: Glucose reference range applies only to samples taken after fasting for at least 8 hours.  Troponin I (High Sensitivity)     Status: Abnormal   Collection Time: 11/05/21  6:17 AM  Result Value Ref Range   Troponin I (High Sensitivity) 182 (HH) <18 ng/L  Comment: CRITICAL RESULT CALLED TO, READ BACK BY AND VERIFIED WITH Gae Dry, [place] 11/05/21 L. KLAR (NOTE) Elevated high sensitivity troponin I (hsTnI) values and significant  changes across serial measurements may suggest ACS but many other  chronic and acute conditions are known to elevate hsTnI results.  Refer to the "Links" section for chest pain algorithms and additional  guidance. Performed at [facility], [place] [address], [place], [place] [address]   CBG monitoring, ED     Status: Abnormal   Collection Time: 11/05/21  8:28 AM  Result Value Ref Range   Glucose-Capillary 101 (H) 70 - 99 mg/dL    Comment: Glucose reference range applies only to samples taken after fasting for at least 8 hours.   Comment 1 Notify RN    Comment 2 Document in Chart   Urinalysis, Complete w Microscopic Urine, Clean Catch     Status: Abnormal   Collection Time: 11/05/21  8:35 AM  Result Value Ref Range   Color, Urine YELLOW YELLOW   APPearance HAZY (A) CLEAR   Specific Gravity, Urine 1.013 1.005 - 1.030   pH 5.0 5.0 - 8.0   Glucose, UA 50 (A) NEGATIVE mg/dL   Hgb urine dipstick NEGATIVE NEGATIVE   Bilirubin Urine NEGATIVE NEGATIVE   Ketones, ur NEGATIVE NEGATIVE mg/dL   Protein, ur NEGATIVE NEGATIVE mg/dL   Nitrite NEGATIVE NEGATIVE   Leukocytes,Ua TRACE (A) NEGATIVE   RBC / HPF 0-5 0 - 5 RBC/hpf   WBC, UA  6-10 0 - 5 WBC/hpf   Bacteria, UA FEW (A) NONE SEEN   Squamous Epithelial / LPF 0-5 0 - 5   Hyaline Casts, UA PRESENT     Comment: Performed at [facility], [address], [place], [place] [address]  Creatinine, urine, random     Status: None   Collection Time: 11/05/21  8:35 AM  Result Value Ref Range   Creatinine, Urine 113 mg/dL    Comment: Performed at [facility] [address], [place], [place] [address]  Sodium, urine, random     Status: None   Collection Time: 11/05/21  8:35 AM  Result Value Ref Range   Sodium, Ur 36 mmol/L    Comment: Performed at [facility] [address] [place], [place] [address]  Rapid urine drug screen (hospital performed)     Status: None   Collection Time: 11/05/21  8:35 AM  Result Value Ref Range   Opiates NONE DETECTED NONE DETECTED   Cocaine NONE DETECTED NONE DETECTED   Benzodiazepines NONE DETECTED NONE DETECTED   Amphetamines NONE DETECTED NONE DETECTED   Tetrahydrocannabinol NONE DETECTED NONE DETECTED   Barbiturates NONE DETECTED NONE DETECTED    Comment: (NOTE) DRUG SCREEN FOR MEDICAL PURPOSES ONLY.  IF CONFIRMATION IS NEEDED FOR ANY PURPOSE, NOTIFY LAB WITHIN 5 DAYS.  LOWEST DETECTABLE LIMITS FOR URINE DRUG SCREEN Drug Class                     Cutoff (ng/mL) Amphetamine and metabolites    1000 Barbiturate and metabolites    200 Benzodiazepine                 [id number] Tricyclics and metabolites     300 Opiates and metabolites        300 Cocaine and metabolites        300 THC  50 Performed at [facility], [address] [place], [place] [address]   CBG monitoring, ED     Status: Abnormal   Collection Time: 11/05/21 11:34 AM  Result Value Ref Range   Glucose-Capillary 65 (L) 70 - 99 mg/dL    Comment: Glucose reference range applies only to samples taken after fasting for at least 8 hours.   Comment 1 Notify RN    Comment 2 Document in Chart   CBG monitoring, ED     Status:  Abnormal   Collection Time: 11/05/21 12:49 PM  Result Value Ref Range   Glucose-Capillary 105 (H) 70 - 99 mg/dL    Comment: Glucose reference range applies only to samples taken after fasting for at least 8 hours.   DG Chest 2 View  Result Date: November 24, 2021 CLINICAL DATA:  Shortness of breath. EXAM: CHEST - 2 VIEW COMPARISON:  Chest x-ray 02/13/2013 FINDINGS: The heart is enlarged. There are central interstitial markings bilaterally with small pleural effusions. There are patchy opacities in the lung bases. No pneumothorax or acute fracture. IMPRESSION: 1.  Cardiomegaly with mild interstitial edema pattern. 2. Small pleural effusions with bibasilar atelectasis/airspace disease. Electronically Signed   By: Darliss Cheney M.D.   On: 2021/11/24 16:12    Pending Labs Unresulted Labs (From admission, onward)     Start     Ordered   11/05/21 0500  Blood gas, venous  Tomorrow morning,   R        November 24, 2021 2259   11/05/21 0020  D-dimer, quantitative  Add-on,   AD        11/05/21 0019   11/24/21 2259  Procalcitonin - Baseline  Add-on,   AD        11/24/2021 2258   2021/11/24 2256  Ethanol  Add-on,   AD        24-Nov-2021 2255   2021-11-24 2240  Hemoglobin A1c  Add-on,   AD       Comments: To assess prior glycemic control    24-Nov-2021 2240            Vitals/Pain Today's Vitals   11/05/21 0700 11/05/21 0730 11/05/21 0830 11/05/21 1100  BP: 127/71 126/64 112/67 125/70  Pulse: 83 89 86 87  Resp: (!) 24 (!) 22 (!) 21 (!) 21  Temp:      SpO2: 96% 96% 97% 96%  Weight:      Height:      PainSc:        Isolation Precautions No active isolations  Medications Medications  albuterol (VENTOLIN HFA) 108 (90 Base) MCG/ACT inhaler 2 puff (2 puffs Inhalation Given 11/05/21 1333)  acetaminophen (TYLENOL) tablet 650 mg (has no administration in time range)    Or  acetaminophen (TYLENOL) suppository 650 mg (has no administration in time range)  dextrose 50 % solution 50 mL (has no administration in time  range)  insulin aspart (novoLOG) injection 0-6 Units (0 Units Subcutaneous Not Given 11/05/21 1144)  furosemide (LASIX) injection 20 mg (20 mg Intravenous Given 11/05/21 0829)  atorvastatin (LIPITOR) tablet 40 mg (40 mg Oral Given 11/05/21 0923)  nicotine (NICODERM CQ - dosed in mg/24 hours) patch 14 mg (has no administration in time range)  aspirin chewable tablet 324 mg (324 mg Oral Given 11/24/21 2134)  furosemide (LASIX) injection 20 mg (20 mg Intravenous Given 11/24/2021 2206)  sodium zirconium cyclosilicate (LOKELMA) packet 10 g (10 g Oral Given 11/05/21 0155)  calcium gluconate 1 g/ 50 mL sodium chloride  IVPB (0 mg Intravenous Stopped 11/05/21 0441)    Mobility walks Low fall risk   Focused Assessments Pulmonary Assessment Handoff:  Lung sounds:   O2 Device: Nasal Cannula O2 Flow Rate (L/min): 3 L/min    R Recommendations: See Admitting Provider Note  Report given to:

## 2021-11-05 NOTE — Assessment & Plan Note (Deleted)
Hyperkalemia,   Renal function wit serum cr down to 1,23 with K at 5,0 and serum bicarbonate at 23.  Continue close follow up on renal function and electrolytes. Diuresis with furosemide.

## 2021-11-05 NOTE — Progress Notes (Signed)
  Echocardiogram 2D Echocardiogram has been performed.  Marie Stone 11/05/2021, 4:16 PM

## 2021-11-05 NOTE — Hospital Course (Addendum)
Marie Stone was admitted to the hospital with the working diagnosis of heart failure.  71 yo female with the past medical history of T2DM and obesity class 2 who presented with dyspnea. Reported 1 week of progressive dyspnea, orthopnea, and lower extremity edema. Positive 40 lbs weight gain over last 2 months. On her initial physical examination her blood pressure 119/53, HR 81-98, RR 18 to 24, 02 saturation 95 to 07% on 3 L per [place]. Lungs with bibasilar rales, with no wheezing, heart with S1 and S2 present and rhythmic with no gallops, abdomen not distended , positive lower extremity edema ++.   Na 138, K 6,1 CL 104 bicarbonate 25 glucose 61 bun 31 cr 1,52  BNP 594  Wbc 7,0 hgb 12,3 plt 199   Chest radiograph with cardiomegaly, bilateral hilar vascular congestion, bilateral interstitial infiltrates predominant at the lower lobes.   EKG 88 bpm, left axis with left anterior fascicular block, sinus rhythm with poor R wave progression, no significant ST segmentor T wave changes.   Patient was placed on furosemide for diuresis.   Echocardiogram with reduced LV systolic function and positive wall motion abnormalities, suggesting ischemic heart disease.   Cardiology was consulted and patient was placed on guideline directed medical therapy for heart failure.  Plan for further ischemic work up during this hospitalization.  Patient had ventricular tachycardia cardiac arrest at around 4 am, she had ACLS per protocol, for 25 minutes.  Unfortunately did not recover her pulse and died. Her family was informed.

## 2021-11-05 NOTE — Assessment & Plan Note (Deleted)
Fasting glucose this am 73 mg/dl.   Continue insulin sliding scale for glucose cover and monitoring.   Continue statin therapy.

## 2021-11-06 ENCOUNTER — Inpatient Hospital Stay: Payer: Self-pay

## 2021-11-06 ENCOUNTER — Encounter (HOSPITAL_COMMUNITY): Payer: Self-pay | Admitting: Internal Medicine

## 2021-11-06 DIAGNOSIS — N179 Acute kidney failure, unspecified: Secondary | ICD-10-CM | POA: Diagnosis not present

## 2021-11-06 DIAGNOSIS — Z720 Tobacco use: Secondary | ICD-10-CM | POA: Diagnosis not present

## 2021-11-06 DIAGNOSIS — E875 Hyperkalemia: Secondary | ICD-10-CM | POA: Diagnosis not present

## 2021-11-06 DIAGNOSIS — I5021 Acute systolic (congestive) heart failure: Secondary | ICD-10-CM

## 2021-11-06 DIAGNOSIS — I5031 Acute diastolic (congestive) heart failure: Secondary | ICD-10-CM | POA: Diagnosis not present

## 2021-11-06 DIAGNOSIS — E1169 Type 2 diabetes mellitus with other specified complication: Secondary | ICD-10-CM | POA: Diagnosis not present

## 2021-11-06 LAB — COOXEMETRY PANEL
Carboxyhemoglobin: 1.5 % (ref 0.5–1.5)
Methemoglobin: 0.8 % (ref 0.0–1.5)
O2 Saturation: 69.9 %
Total hemoglobin: 11.1 g/dL — ABNORMAL LOW (ref 12.0–16.0)

## 2021-11-06 LAB — BASIC METABOLIC PANEL
Anion gap: 10 (ref 5–15)
BUN: 25 mg/dL — ABNORMAL HIGH (ref 8–23)
CO2: 23 mmol/L (ref 22–32)
Calcium: 8.9 mg/dL (ref 8.9–10.3)
Chloride: 106 mmol/L (ref 98–111)
Creatinine, Ser: 1.23 mg/dL — ABNORMAL HIGH (ref 0.44–1.00)
GFR, Estimated: 47 mL/min — ABNORMAL LOW (ref >60)
Glucose, Bld: 73 mg/dL (ref 70–99)
Potassium: 5 mmol/L (ref 3.5–5.1)
Sodium: 139 mmol/L (ref 135–145)

## 2021-11-06 LAB — GLUCOSE, CAPILLARY
Glucose-Capillary: 77 mg/dL (ref 70–99)
Glucose-Capillary: 106 mg/dL — ABNORMAL HIGH (ref 70–99)
Glucose-Capillary: 187 mg/dL — ABNORMAL HIGH (ref 70–99)
Glucose-Capillary: 174 mg/dL — ABNORMAL HIGH (ref 70–99)

## 2021-11-06 LAB — MAGNESIUM: Magnesium: 2.2 mg/dL (ref 1.7–2.4)

## 2021-11-06 MED ORDER — INSULIN ASPART 100 UNIT/ML IJ SOLN: 0.0000 [IU] | Freq: Three times a day (TID) | INTRAMUSCULAR | Status: DC

## 2021-11-06 MED ORDER — SODIUM CHLORIDE 0.9% FLUSH
10.0000 mL | INTRAVENOUS | Status: DC | PRN
  Administered 2021-11-06: 10 mL

## 2021-11-06 MED ORDER — CHLORHEXIDINE GLUCONATE CLOTH 2 % EX PADS
6.0000 | MEDICATED_PAD | Freq: Every day | CUTANEOUS | Status: DC
  Administered 2021-11-06: 6 via TOPICAL

## 2021-11-06 MED ORDER — SODIUM CHLORIDE 0.9% FLUSH
10.0000 mL | Freq: Two times a day (BID) | INTRAVENOUS | Status: DC
  Administered 2021-11-07: 10 mL

## 2021-11-06 MED ORDER — DIGOXIN 125 MCG PO TABS
0.1250 mg | ORAL_TABLET | Freq: Every day | ORAL | Status: DC
  Administered 2021-11-06: 0.125 mg via ORAL
  Filled 2021-11-06: qty 1

## 2021-11-06 MED ORDER — ASPIRIN 81 MG PO TBEC
81.0000 mg | DELAYED_RELEASE_TABLET | Freq: Every day | ORAL | Status: DC
  Administered 2021-11-06: 81 mg via ORAL
  Filled 2021-11-06: qty 1

## 2021-11-06 MED ORDER — EMPAGLIFLOZIN 10 MG PO TABS
10.0000 mg | ORAL_TABLET | Freq: Every day | ORAL | Status: DC
  Administered 2021-11-06: 10 mg via ORAL
  Filled 2021-11-06: qty 1

## 2021-11-06 MED ORDER — FUROSEMIDE 10 MG/ML IJ SOLN
80.0000 mg | Freq: Two times a day (BID) | INTRAMUSCULAR | Status: DC
  Administered 2021-11-06: 80 mg via INTRAVENOUS
  Filled 2021-11-06: qty 8

## 2021-11-06 NOTE — Progress Notes (Signed)
Progress Note   Patient: Marie Stone OZH:[id number] DOB: [birth year] DOA: 11/08/2021     2 DOS: the patient was seen and examined on 11/06/2021   Brief hospital course: Marie Stone was admitted to the hospital with the working diagnosis of heart failure.  71 yo female with the past medical history of T2DM and obesity class 2 who presented with dyspnea. Reported 1 week of progressive dyspnea, orthopnea, and lower extremity edema. Positive 40 lbs weight gain over last 2 months. On her initial physical examination her blood pressure 119/53, HR 81-98, RR 18 to 24, 02 saturation 95 to 07% on 3 L per [place]. Lungs with bibasilar rales, with no wheezing, heart with S1 and S2 present and rhythmic with no gallops, abdomen not distended , positive lower extremity edema ++.   Na 138, K 6,1 CL 104 bicarbonate 25 glucose 61 bun 31 cr 1,52  BNP 594  Wbc 7,0 hgb 12,3 plt 199   Chest radiograph with cardiomegaly, bilateral hilar vascular congestion, bilateral interstitial infiltrates predominant at the lower lobes.   EKG 88 bpm, left axis with left anterior fascicular block, sinus rhythm with poor R wave progression, no significant ST segmentor T wave changes.   Patient was placed on furosemide for diuresis.   Echocardiogram with reduced LV systolic function and positive wall motion abnormalities, suggesting ischemic heart disease.    Assessment and Plan: * Acute diastolic heart failure (HCC) Echocardiogram with decreased LV systolic function, 20 to 25% with antero septal , anterior, inferior septal and apical akinesis. Mild dilatation of LV cavity, RV systolic function preserved, RVSP 36,2. No significant valvular disease.   Urine output is documented 300 cc Clinically volume status has improved but not back to baseline.   Plan to continue diuresis with furosemide 80 mg IV q12 hrs.  Continue empagliflozin  Added digoxin  Will need coronary angiography this admission.  Add aspirin and continue with  statin therapy.   Acute respiratory failure due to acute cardiogenic pulmonary edema. Oxygenation today is 95% on 3 L/min per [place]  AKI (acute kidney injury) (HCC) Hyperkalemia,   Renal function wit serum cr down to 1,23 with K at 5,0 and serum bicarbonate at 23.  Continue close follow up on renal function and electrolytes. Diuresis with furosemide.   Type 2 diabetes mellitus with hyperlipidemia (HCC) Fasting glucose this am 73 mg/dl.   Continue insulin sliding scale for glucose cover and monitoring.   Continue statin therapy.   Tobacco abuse Smoking cessation counseling.   Class 2 obesity Calculated BMI is 37.4         Subjective: Patient with no chest pain, dyspnea and lower extremities edema are improving but not back to baseline   Physical Exam: Vitals:   11/06/21 0053 11/06/21 0433 11/06/21 0751 11/06/21 1126  BP: 108/61 107/63 (!) 102/53 97/62  Pulse: 83 85  85  Resp: 20 20 18 19   Temp: 98.6 F (37 C) 99.1 F (37.3 C) 98.3 F (36.8 C) 98.4 F (36.9 C)  TempSrc: Oral Oral Oral Oral  SpO2: 95% 95% 94% 98%  Weight:  100.4 kg    Height:       Neurology awake and alert ENT with no pallor Cardiovascular with S1 and S2 present and rhythmic with no gallops or murmurs No JVD Positive lower extremity edema + to ++ Respiratory with scattered rales but no wheezing Abdomen not distended  Data Reviewed:    Family Communication:  I spoke with patient's husband at the bedside, we  talked in detail about patient's condition, plan of care and prognosis and all questions were addressed.   Disposition: Status is: Inpatient Remains inpatient appropriate because: heart failure   Planned Discharge Destination: Home    Author: Coralie Keens, MD 11/06/2021 4:18 PM  For on call review [email or web address].

## 2021-11-06 NOTE — Progress Notes (Signed)
Heart Failure Navigator Progress Note  Assessed for Heart & Vascular TOC clinic readiness.  Patient does not meet criteria due to consulted with Advanced Heart Failure Team..    Nekesha Font, BSN, RN Heart Failure Nurse Navigator Secure Chat Only   

## 2021-11-06 NOTE — Consult Note (Addendum)
Advanced Heart Failure Team Consult Note   Primary Physician: Marie Azreal Stthomas, PA-C PCP-Cardiologist:  None  Reason for Consultation: Acute Systolic Heart Failure  HPI:    Marie Stone is seen today for evaluation of Acute systolic heart failure at the request of Dr. Ella Stone.   Marie Stone is a 71y.o. female with PMH of obesity, DM II, diabetic neuropathy, and chronic tobacco abuse who presented to the ED with new onset of shortness of breath. She originally went to her PCP Marie Stone on 7/12 who advised patient to come to the ED for suspected acute HF.  In the ED patient presented with orthopnea, SOB, and dyspnea on exertion. She had new onset BLE edema which started in her R leg around April but within the last week swelling progressed to both legs and abdomen. 2 weeks ago EMT went to her house because (per husband at bedside) she "wasn't being herself". She was smoking a cigarette when her daughter noticed she was off. EMT arrived and unsure if EKG was done then but they treated her for hypoglycemia and within 3 hours she was back to baseline. Denies any SOB or chest pain at the time. Since then she has progressively worsened. She has never experienced these symptoms in the past. She has been unable to get around much due to the swelling but she was barely able to walk into the doctors office on Wednesday d/t SOB. At home unable to lay flat at baseline, she sleeps in a recliner d/t pain, and was unable to lay flat here in the hospital d/t orthopnea. Smokes 3/4 pack/day for approximately 20 years. Within the last two months she has had a 40 lb weight gain. Experienced intermittent dizziness since then.   Pertinent labs: K 6.1 received Lokelma 10g x1, BNP 594, Cr 1.52, HsTrop 48>>77>57>>182. Received albuterol, ASA, and 20mg  IV lasix x1, patient reports good UOP however minimal documentation.   Imaging reviewed: CXR showed: cardiomegaly with interstitial edema, small bilateral pleural effusions,  bibasilar airspace opacity favoring atelectasis relative to infiltrate, demonstrating no evidence of pneumothorax. EKG 27-Nov-2021- sinus rhythm but low voltage, strain and some T-wave changes. No obvious ischemic changes.  ECHO 10/14: LV 50-55%, normal wall motion. No regional wall abnormalities. No MR. Mild TR. LA mildly dilated. RV cavity size normal. PA normal sized, systolic pressure was mildly to moderately increased. 11/14   ECHO 07/23:  LVEF: 20-25%, LV demonstrates regional wall motion abnormalities with anteroseptal, anterior, inferoseptal, and apical akinesis. No LV thrombus noted. RV systolic function is normal. The RV size is normal. LA was mildly dilated. Trivial MR and TR. IVC is dilated in size.   Pt up in chair, no difficulty breathing or having conversation. Husband at bedside. Denies SOB, chest pain, N/V.   Review of Systems: [y] = yes, [ ]  = no   General: Weight gain [Y ]; Weight loss [ ] ; Anorexia [ ] ; Fatigue [ ] ; Fever [ ] ; Chills [ ] ; Weakness [ Y]  Cardiac: Chest pain/pressure [ ] ; Resting SOB [ ] ; Exertional SOB [ Y]; Orthopnea [ Y]; Pedal Edema [ Y]; Palpitations [ ] ; Syncope [ ] ; Presyncope [ Y]; Paroxysmal nocturnal dyspnea[ ]   Pulmonary: Cough [ ] ; Wheezing[ ] ; Hemoptysis[ ] ; Sputum [ ] ; Snoring [ ]   GI: Vomiting[ ] ; Dysphagia[ ] ; Melena[ ] ; Hematochezia [ ] ; Heartburn[ ] ; Abdominal pain [ ] ; Constipation [ ] ; Diarrhea [ ] ; BRBPR [ ]   GU: Hematuria[ ] ; Dysuria [ ] ; Nocturia[ ]   Vascular: Pain in  legs with walking [ Y]; Pain in feet with lying flat [ ] ; Non-healing sores [ ] ; Stroke [ ] ; TIA [ ] ; Slurred speech [ ] ;  Neuro: Headaches[ ] ; Vertigo[ ] ; Seizures[ ] ; Paresthesias[ ] ;Blurred vision [ ] ; Diplopia [ ] ; Vision changes [ ]   Ortho/Skin: Arthritis [ ] ; Joint pain [ ] ; Muscle pain [ ] ; Joint swelling [ Y]; Back Pain [ ] ; Rash [ ]   Psych: Depression[ ] ; Anxiety[ ]   Heme: Bleeding problems [ ] ; Clotting disorders [ ] ; Anemia [ ]   Endocrine: Diabetes [ Y]; Thyroid  dysfunction[ ]   Home Medications Prior to Admission medications   Medication Sig Start Date End Date Taking? Authorizing Provider  atorvastatin (LIPITOR) 40 MG tablet Take 40 mg by mouth daily. 02/12/21  Yes [provider]  etodolac (LODINE) 400 MG tablet Take 400 mg by mouth 2 (two) times daily. 11/02/21  Yes [provider]  gabapentin (NEURONTIN) 300 MG capsule Take 300 mg by mouth 3 (three) times daily. 12/16/20  Yes [provider]  glimepiride (AMARYL) 2 MG tablet Take 2 mg by mouth 2 (two) times daily. 04/16/18  Yes [provider]  JARDIANCE 25 MG TABS tablet Take 25 mg by mouth daily. 05/19/18  Yes [provider]  lisinopril (ZESTRIL) 10 MG tablet Take 1 tablet by mouth daily. 12/16/20  Yes [provider]  meloxicam (MOBIC) 15 MG tablet TAKE 1 TABLET (15 MG TOTAL) BY MOUTH DAILY. 06/08/21  Yes Marie Stone, DPM  pioglitazone (ACTOS) 30 MG tablet Take 30 mg by mouth daily. 10/06/21  Yes [provider]  sitaGLIPtin (JANUVIA) 100 MG tablet Take 100 mg by mouth daily.   Yes [provider]  Blood Glucose Monitoring Suppl DEVI Use as directed twice daily. Dispense meter best covered by insurance 07/25/14   [provider]  doxycycline (VIBRA-TABS) 100 MG tablet Take 1 tablet (100 mg total) by mouth 2 (two) times daily. Patient not taking: Reported on 11/08/2021 02/04/21   Marie Stone, DPM  gentamicin cream (GARAMYCIN) 0.1 % Apply 1 application topically 2 (two) times daily. Patient not taking: Reported on 11/02/2021 02/04/21   Marie Stone, DPM  Glucose Blood (BLOOD GLUCOSE TEST STRIPS) STRP Use to check blood sugar 2 times per day. 07/21/16   Marie Shin, MD  glucose blood [facility] VERIO) test strip 1 each by Other route 2 (two) times daily. And lancets 2/day 07/19/16   Marie Shin, MD  Lancets MISC Use to check blood sugar 2 times per day. 07/22/16   Marie Shin, MD    Past Medical History: Past  Medical History:  Diagnosis Date   Diabetes mellitus without complication [facility])     Past Surgical History: Past Surgical History:  Procedure Laterality Date   LEG SURGERY     TUBAL LIGATION      Family History: Family History  Problem Relation Age of Onset   Diabetes Mother    CHF Mother    Cancer  Father     CKD Brother    Breast cancer Maternal Aunt    Breast cancer Maternal Grandmother     Social History: Social History   Socioeconomic History   Marital status: Married    Spouse name: Juanda Crumble   Number of children: 2   Years of education: Not on file   Highest education level: High school graduate  Occupational History   Occupation: Retired  Tobacco Use   Smoking status: Every Day    Packs/day: 0.75  Types: Cigarettes   Smokeless tobacco: Never  Vaping Use   Vaping Use: Never used  Substance and Sexual Activity   Alcohol use: No   Drug use: No   Sexual activity: Not Currently    Birth control/protection: Surgical  Other Topics Concern   Not on file  Social History Narrative   Not on file   Social Determinants of Health   Financial Resource Strain: Not on file  Food Insecurity: No Food Insecurity (11/06/2021)   Hunger Vital Sign    Worried About Running Out of Food in the Last Year: Never true    Ran Out of Food in the Last Year: Never true  Transportation Needs: No Transportation Needs (11/06/2021)   PRAPARE - Hydrologist (Medical): No    Lack of Transportation (Non-Medical): No  Physical Activity: Not on file  Stress: Not on file  Social Connections: Not on file    Allergies:  Allergies  Allergen Reactions   Metformin And Related Diarrhea    Objective:    Vital Signs:   Temp:  [97.7 F (36.5 C)-99.1 F (37.3 C)] 98.4 F (36.9 C) (07/14 1126) Pulse Rate:  [83-93] 85 (07/14 1126) Resp:  [16-22] 19 (07/14 1126) BP: (97-127)/(51-63) 97/62 (07/14 1126) SpO2:  [91 %-98 %] 98 % (07/14 1126) Weight:  [100.4  kg-102.1 kg] 100.4 kg (07/14 0433) Last BM Date : 11/03/21  Weight change: Filed Weights   10/30/2021 1524 11/05/21 1645 11/06/21 0433  Weight: 89.4 kg 102.1 kg 100.4 kg    Intake/Output:   Intake/Output Summary (Last 24 hours) at 11/06/2021 1204 Last data filed at 11/06/2021 0920 Gross per 24 hour  Intake --  Output 600 ml  Net -600 ml      Physical Exam    General:  obese, ill appearing. Looks stated age. No respiratory difficulty HEENT: normal Neck: supple. JVD 12 cm. Carotids 2+ bilat; no bruits. No lymphadenopathy or thyromegaly appreciated. Cor: PMI nondisplaced. Regular rate & rhythm, distant heart sounds. No rubs, gallops or murmurs. Lungs: clear, diminished at bases Abdomen: soft, nontender, nondistended. Non-pitting edema. No hepatosplenomegaly. No bruits or masses. Good bowel sounds. Extremities: no cyanosis, clubbing, rash. Bilateral lower extremity edema, R>L. Varicose veins.  Neuro: alert & oriented x 3, cranial nerves grossly intact. moves all 4 extremities w/o difficulty. Affect pleasant.  Telemetry   NSR in 80's (personally reviewed)  EKG    11/11/2021: NSR 95 bpm, anteroseptal infarct, age undetermined  Labs   Basic Metabolic Panel: Recent Labs  Lab 11/12/2021 1525 11/18/2021 2228 11/05/21 0205 11/05/21 0611 11/06/21 0511  NA 138  --  139 139 139  K 6.1*  --  5.3* 5.5* 5.0  CL 104  --  105  --  106  CO2 25  --  27  --  23  GLUCOSE 61*  --  108*  --  73  BUN 31*  --  31*  --  25*  CREATININE 1.52*  --  1.70*  --  1.23*  CALCIUM 9.1  --  8.8*  --  8.9  MG  --  1.3* 2.4  --  2.2  PHOS  --   --  5.2*  --   --     Liver Function Tests: Recent Labs  Lab 10/25/2021 1525 11/05/21 0205  AST 26 20  ALT 12 12  ALKPHOS 87 74  BILITOT 0.7 0.5  PROT 7.5 6.5  ALBUMIN 3.4* 3.0*   No results for input(s): "  LIPASE", "AMYLASE" in the last 168 hours. No results for input(s): "AMMONIA" in the last 168 hours.  CBC: Recent Labs  Lab 11/21/2021 1525  11/05/21 0205 11/05/21 0611  WBC 7.0 6.5  --   NEUTROABS 5.5 4.9  --   HGB 12.3 10.8* 11.6*  HCT 38.2 34.2* 34.0*  MCV 92.5 93.2  --   PLT 199 158  --     Cardiac Enzymes: No results for input(s): "CKTOTAL", "CKMB", "CKMBINDEX", "TROPONINI" in the last 168 hours.  BNP: BNP (last 3 results) Recent Labs    11/19/2021 1522  BNP 594.2*    ProBNP (last 3 results) No results for input(s): "PROBNP" in the last 8760 hours.   CBG: Recent Labs  Lab 11/05/21 1134 11/05/21 1249 11/05/21 2134 11/06/21 0601 11/06/21 1124  GLUCAP 65* 105* 112* 77 106*    Coagulation Studies: No results for input(s): "LABPROT", "INR" in the last 72 hours.   Imaging   ECHOCARDIOGRAM COMPLETE  Result Date: 11/05/2021    ECHOCARDIOGRAM REPORT   Patient Name:   TAJHA MCCLURKIN Date of Exam: 11/05/2021 Medical Rec #:  [id number]     Height:       64.0 in Accession #:    [id number]    Weight:       197.0 lb Date of Birth:  Mar 13, 1951      BSA:          1.944 m Patient Age:    57 years      BP:           152/135 mmHg Patient Gender: F             HR:           94 bpm. Exam Location:  Inpatient Procedure: 2D Echo, Cardiac Doppler, Color Doppler and Intracardiac            Opacification Agent Indications:    CHF-Acute Diastolic [id number]  History:        Patient has prior history of Echocardiogram examinations, most                 recent 02/16/2013. Risk Factors:Diabetes.  Sonographer:    Bernadene Person RDCS Referring Phys: [id number] [place]  1. Left ventricular ejection fraction, by estimation, is 20 to 25%. The left ventricle has severely decreased function. The left ventricle demonstrates regional wall motion abnormalities with anteroseptal, anterior, inferoseptal, and apical akinesis. No  LV thrombus noted. The left ventricular internal cavity size was mildly dilated. Left ventricular diastolic parameters are consistent with Grade II diastolic dysfunction (pseudonormalization).  2. Right  ventricular systolic function is normal. The right ventricular size is normal. There is mildly elevated pulmonary artery systolic pressure. The estimated right ventricular systolic pressure is 0000000 mmHg.  3. Left atrial size was mildly dilated.  4. The mitral valve is normal in structure. Trivial mitral valve regurgitation. No evidence of mitral stenosis.  5. The aortic valve is tricuspid. Aortic valve regurgitation is not visualized. No aortic stenosis is present.  6. The inferior vena cava is dilated in size with <50% respiratory variability, suggesting right atrial pressure of 15 mmHg. FINDINGS  Left Ventricle: Left ventricular ejection fraction, by estimation, is 20 to 25%. The left ventricle has severely decreased function. The left ventricle demonstrates regional wall motion abnormalities. Definity contrast agent was given IV to delineate the left ventricular endocardial borders. The left ventricular internal cavity size was mildly dilated. There is no left ventricular hypertrophy. Left ventricular diastolic  parameters are consistent with Grade II diastolic dysfunction (pseudonormalization). Right Ventricle: The right ventricular size is normal. No increase in right ventricular wall thickness. Right ventricular systolic function is normal. There is mildly elevated pulmonary artery systolic pressure. The tricuspid regurgitant velocity is 2.30  m/s, and with an assumed right atrial pressure of 15 mmHg, the estimated right ventricular systolic pressure is 36.2 mmHg. Left Atrium: Left atrial size was mildly dilated. Right Atrium: Right atrial size was normal in size. Pericardium: There is no evidence of pericardial effusion. Mitral Valve: The mitral valve is normal in structure. Trivial mitral valve regurgitation. No evidence of mitral valve stenosis. Tricuspid Valve: The tricuspid valve is normal in structure. Tricuspid valve regurgitation is trivial. Aortic Valve: The aortic valve is tricuspid. Aortic valve  regurgitation is not visualized. No aortic stenosis is present. Pulmonic Valve: The pulmonic valve was normal in structure. Pulmonic valve regurgitation is not visualized. Aorta: The aortic root is normal in size and structure. Venous: The inferior vena cava is dilated in size with less than 50% respiratory variability, suggesting right atrial pressure of 15 mmHg. IAS/Shunts: No atrial level shunt detected by color flow Doppler.  LEFT VENTRICLE PLAX 2D LVIDd:         5.60 cm      Diastology LVIDs:         4.10 cm      LV e' medial:    4.95 cm/s LV PW:         0.90 cm      LV E/e' medial:  26.5 LV IVS:        0.80 cm      LV e' lateral:   8.04 cm/s LVOT diam:     1.90 cm      LV E/e' lateral: 16.3 LV SV:         46 LV SV Index:   23 LVOT Area:     2.84 cm  LV Volumes (MOD) LV vol d, MOD A2C: 97.6 ml LV vol d, MOD A4C: 146.0 ml LV vol s, MOD A2C: 76.5 ml LV vol s, MOD A4C: 108.0 ml LV SV MOD A2C:     21.1 ml LV SV MOD A4C:     146.0 ml LV SV MOD BP:      31.9 ml RIGHT VENTRICLE RV S prime:     11.00 cm/s TAPSE (M-mode): 1.8 cm LEFT ATRIUM             Index        RIGHT ATRIUM           Index LA diam:        3.90 cm 2.01 cm/m   RA Area:     13.30 cm LA Vol (A2C):   49.6 ml 25.52 ml/m  RA Volume:   30.80 ml  15.84 ml/m LA Vol (A4C):   46.3 ml 23.82 ml/m LA Biplane Vol: 50.4 ml 25.93 ml/m  AORTIC VALVE LVOT Vmax:   85.00 cm/s LVOT Vmean:  58.700 cm/s LVOT VTI:    0.161 m  AORTA Ao Root diam: 3.10 cm Ao Asc diam:  3.00 cm MITRAL VALVE                TRICUSPID VALVE MV Area (PHT): 5.38 cm     TR Peak grad:   21.2 mmHg MV Decel Time: 141 msec     TR Vmax:        230.00 cm/s MV E velocity: 131.00 cm/s MV A velocity:  103.00 cm/s  SHUNTS MV E/A ratio:  1.27         Systemic VTI:  0.16 m                             Systemic Diam: 1.90 cm Dalton McleanMD Electronically signed by Wilfred Lacy Signature Date/Time: 11/05/2021/5:25:53 PM    Final      Medications:     Current Medications:  atorvastatin  40 mg Oral  Daily   furosemide  40 mg Intravenous BID   gabapentin  300 mg Oral TID   insulin aspart  0-6 Units Subcutaneous TID WC    Infusions:    Patient Profile   Marie Stone is a 71y.o. female with PMH of obesity, DM II, diabetic neuropathy, and chronic tobacco abuse who presented to the ED with new onset of shortness of breath. ECHO on admission showed new lower EF 20-25% from previous 50-55%. , AHF team consulted for management of new Acute Systolic Heart Failure.   Assessment/Plan   Acute Systolic Heart Failure -  ECHO this admit EF now down to 20 -25% from previous 50-55% back in 2014. Regional wall motion abnormalities with anteroseptal, anterior, inferoseptal, and apical akinesis. No LV thrombus noted. RV systolic function is normal. Concerned this is ICM with risk factors such as tobacco abuse.and DM. Possible OOH silent MI.  - CXR: evidence of interstitial edema as well as small bilateral pleural effusions, BNP 594, HsTrop 48>>77>57>>182 - Presented with NYHA class IV  - Will need diagnostic cath once stabilized to further assess.  - start digoxin 0.125 mg  - Appears volume overloaded. Increase IV lasix to 80 mg twice a day.  - No ARNi/MRA with hyperkalemia. K now down to 5. Would not restart lisinopril.  - Continue jardiance 10 mg daily.  - Place PICC, coox daily, CVP Q shift.  - Strict I&O, daily weights  DM II - per primary - Hgb A1c pending -On SSI   Hyperkalemia - received Lokelma in ED - Daily BMP, last K 5 today  Chronic pain in right foot, varicose veins of leg with swelling, right   - Venous US (-) DVT - Place ted hoses on patient  - CTA abdomen/pelvis pending, per primary - continue gabapentin 300 mg TID  5. Tobacco Abuse - smoking cessation encouraged - PRN nicotine patches    Length of Stay: 2  Alen Bleacher, AGACNP-BC 11/06/2021, 12:04 PM  Advanced Heart Failure Team Pager [phone] (M-F; 7a - 5p)  Please contact [facility] for night-coverage after  hours (4p -7a ) and weekends on [email or web address]   Patient seen and examined with the above-signed Advanced Practice Provider and/or Housestaff. I personally reviewed laboratory data, imaging studies and relevant notes. I independently examined the patient and formulated the important aspects of the plan. I have edited the note to reflect any of my changes or salient points. I have personally discussed the plan with the patient and/or family.  71 y/o woman with morbid obesity, DM2, tobacco use admitted with acute systolic HF. Had episode about 2 weeks ago during which she didn't feel well. Over past week. Worsening edema, DOE. No CP  Echo EF 20-25% with antero/apical AK. ECG with anterolateral qs  General:  Obese woman sitting in chair No resp difficulty HEENT: normal Neck: supple. JVP to jaw Carotids 2+ bilat; no bruits. No lymphadenopathy or thryomegaly appreciated. Cor: PMI nondisplaced. Regular rate & rhythm. No rubs, gallops  or murmurs. Lungs: decreased throughout Abdomen: obese soft, nontender, nondistended. No hepatosplenomegaly. No bruits or masses. Good bowel sounds. Extremities: no cyanosis, clubbing, rash, 2-3+ edema RLE + varicose veins Neuro: alert & orientedx3, cranial nerves grossly intact. moves all 4 extremities w/o difficulty. Affect pleasant  Suspect severe iCM due to silent OOH anterior MI. Now volume overloaded.   Diurese with IV lasix. Start dig. Titrate GDMT as tolerated. Plan R/L cath once diuresed. Place TEDs and PICC. R/L cath next week when diuresed.  Glori Bickers, MD  4:26 PM

## 2021-11-06 NOTE — Progress Notes (Addendum)
Mobility Specialist Progress Note:   11/06/21 1540  Mobility  Activity Ambulated with assistance in hallway  Level of Assistance Contact guard assist, steadying assist  Assistive Device Front wheel walker  Distance Ambulated (ft) 80 ft  Activity Response Tolerated well  $Mobility charge 1 Mobility   Pt received in bed willing to participate in mobility. No complaints of pain. Left in bed with call bell in reach and all needs met.   [facility] Patrik Turnbaugh Mobility Specialist

## 2021-11-06 NOTE — TOC Progression Note (Signed)
Transition of Care [facility]) - Progression Note    Patient Details  Name: Marie Stone MRN: [id number] Date of Birth: 1951/03/06  Transition of Care [facility]) CM/SW Contact  Leone Haven, RN Phone Number: 11/06/2021, 4:24 PM  Clinical Narrative:    From home, HF, conts on IV lasix, ambulatory, wears oxygen at night.  TOC following.         Expected Discharge Plan and Services                                                 Social Determinants of Health (SDOH) Interventions Food Insecurity Interventions: Intervention Not Indicated Financial Strain Interventions: Intervention Not Indicated Housing Interventions: Intervention Not Indicated Transportation Interventions: Intervention Not Indicated  Readmission Risk Interventions     No data to display

## 2021-11-06 NOTE — Progress Notes (Signed)
Peripherally Inserted Central Catheter Placement  The IV Nurse has discussed with the patient and/or persons authorized to consent for the patient, the purpose of this procedure and the potential benefits and risks involved with this procedure.  The benefits include less needle sticks, lab draws from the catheter, and the patient may be discharged home with the catheter. Risks include, but not limited to, infection, bleeding, blood clot (thrombus formation), and puncture of an artery; nerve damage and irregular heartbeat and possibility to perform a PICC exchange if needed/ordered by physician.  Alternatives to this procedure were also discussed.  Bard Power PICC patient education guide, fact sheet on infection prevention and patient information card has been provided to patient /or left at bedside.    PICC Placement Documentation  PICC Double Lumen 11/06/21 Right Brachial 43 cm 0 cm (Active)  Indication for Insertion or Continuance of Line Prolonged intravenous therapies 11/06/21 1800  Exposed Catheter (cm) 0 cm 11/06/21 1800  Site Assessment Clean, Dry, Intact 11/06/21 1800  Lumen #1 Status Flushed;Blood return noted;Saline locked 11/06/21 1800  Lumen #2 Status Flushed;Blood return noted;Saline locked 11/06/21 1800  Dressing Type Transparent;Securing device 11/06/21 1800  Dressing Status Clean, Dry, Intact;Antimicrobial disc in place 11/06/21 1800  Dressing Change Due 11/13/21 11/06/21 1800       Stacie Glaze [place] 11/06/2021, 6:06 PM

## 2021-11-06 NOTE — Care Management Important Message (Signed)
Important Message  Patient Details  Name: Marie Stone MRN: [id number] Date of Birth: April 21, 1951   Medicare Important Message Given:  Yes     Renie Ora 11/06/2021, 9:10 AM

## 2021-11-07 ENCOUNTER — Inpatient Hospital Stay (HOSPITAL_COMMUNITY): Payer: Medicare Other | Admitting: Anesthesiology

## 2021-11-07 ENCOUNTER — Encounter (HOSPITAL_COMMUNITY): Payer: Self-pay | Admitting: Internal Medicine

## 2021-11-07 LAB — BASIC METABOLIC PANEL
Anion gap: 6 (ref 5–15)
BUN: 24 mg/dL — ABNORMAL HIGH (ref 8–23)
CO2: 29 mmol/L (ref 22–32)
Calcium: 9 mg/dL (ref 8.9–10.3)
Chloride: 103 mmol/L (ref 98–111)
Creatinine, Ser: 1.22 mg/dL — ABNORMAL HIGH (ref 0.44–1.00)
GFR, Estimated: 48 mL/min — ABNORMAL LOW (ref >60)
Glucose, Bld: 125 mg/dL — ABNORMAL HIGH (ref 70–99)
Potassium: 4.9 mmol/L (ref 3.5–5.1)
Sodium: 138 mmol/L (ref 135–145)

## 2021-11-07 LAB — HEMOGLOBIN A1C
Hgb A1c MFr Bld: 6.9 % — ABNORMAL HIGH (ref 4.8–5.6)
Mean Plasma Glucose: 151.33 mg/dL

## 2021-11-07 LAB — TSH: TSH: 4.428 u[IU]/mL (ref 0.350–4.500)

## 2021-11-07 LAB — COOXEMETRY PANEL
Carboxyhemoglobin: 1.5 % (ref 0.5–1.5)
Methemoglobin: <0.7 % (ref 0.0–1.5)
O2 Saturation: 60.4 %
Total hemoglobin: 11.5 g/dL — ABNORMAL LOW (ref 12.0–16.0)

## 2021-11-13 ENCOUNTER — Ambulatory Visit: Payer: Medicare Other | Admitting: Podiatry

## 2021-11-20 ENCOUNTER — Other Ambulatory Visit: Payer: Medicare Other

## 2021-11-20 ENCOUNTER — Encounter (HOSPITAL_COMMUNITY): Payer: Medicare Other

## 2021-11-24 NOTE — Progress Notes (Signed)
Situation: Initial visit for pt Marie Stone, chaplain responding to page for code blue resulting in death.  Background: Facts: This chaplain sat in on the meeting with family and the MD when it was shared that [name] had not survived the code blue.  Family: Mr. Greenhouse (pt's husband) arrived after the code had been called. He spoke with his daughter, Neysa Bonito, via phone. Mr. Gasparyan shared that his daughter has limited mobility and would not be able to come to the hospital. Mr. Chesbro also shared that he and his wife would have celebrated 51 years this year. Feelings: Mr. Gugel was tearful throughout the visit, expressing sadness in waves of emotion. He stated, "I don't know what I'm gonna do" and "I don't know what happened.Marland KitchenMarland KitchenI never expected this when I left last night." Faith: Mr. Flitton shared that a pastor had visit yesterday and that Ms. Hatfield "got saved" during that visit. He expressed "Thank God she got saved" and "at least we know where she is," while maintaining, "that doesn't make it any easier." Mr. Newsom expressed desire for prayer, which this chaplain provided. He further shared "I thank God that you were here to pray with me."  Actions & Assessments: Chaplain offered compassionate presence, grief support, and prayer. Mr. Skolnick seems to be coming to terms with the very recent news of his wife's death. He also seems to have plans in place as he was able to share funeral information at this time.  Recommendations: Chaplain services remain available for follow-up spiritual/emotional support as needed.  Rev. Orpha Bur Medinas-Lockley, MDiv      11-25-2021 0600  Clinical Encounter Type  Visited With Family;Patient not available  Visit Type Initial;Code;Death  Spiritual Encounters  Spiritual Needs Grief support;Prayer

## 2021-11-24 NOTE — Anesthesia Procedure Notes (Signed)
Procedure Name: Intubation Date/Time: 11/10/2021 4:23 AM  Performed by: Edmonia Caprio, CRNAPre-anesthesia Checklist: Patient identified, Emergency Drugs available, Suction available, Patient being monitored and Timeout performed Oxygen Delivery Method: Ambu bag Preoxygenation: Pre-oxygenation with 100% oxygen Laryngoscope Size: Miller and 2 Grade View: Grade I Tube type: Oral Tube size: 7.5 mm Number of attempts: 2 Airway Equipment and Method: Stylet Placement Confirmation: ETT inserted through vocal cords under direct vision, positive ETCO2 and breath sounds checked- equal and bilateral Secured at: 23 cm Tube secured with: Tape Dental Injury: Teeth and Oropharynx as per pre-operative assessment  Comments: Called to code blue, BVM with ambu and ACLS/CPR in progress.  DL with glidescope but copious frothy pink secretions made visiualization impossible.  Suctioned, DL with Mil 2, intubated with 7.5 ETT and secured by RT.  +[id number] via zoll, +BBS.

## 2021-11-24 NOTE — Progress Notes (Signed)
Pt had 6-8 beats of V tach. Pt asymptomatic and sleeping. Will continue to monitor.

## 2021-11-24 NOTE — Death Summary Note (Addendum)
DEATH SUMMARY   Patient Details  Name: Marie Stone MRN: [id number] DOB: Oct 27, 1950 [id number], [name], PA-C Admission/Discharge Information   Admit Date:  Nov 29, 2021  Date of Death: Date of Death: 2021/12/02  Time of Death: Time of Death: 0432  Length of Stay: 3   Principle Cause of death: Heart failure with ischemic cardiomyopathy, ventricular tachycardia.   Hospital Diagnoses: Principal Problem:   Acute clinical systolic heart failure ([place]) Active Problems:   AKI (acute kidney injury) ([place])   Type 2 diabetes mellitus with hyperlipidemia (HCC)   Tobacco abuse   Class 2 obesity   Hospital Course: Mrs. Solarz was admitted to the hospital with the working diagnosis of heart failure.  71 yo female with the past medical history of T2DM and obesity class 2 who presented with dyspnea. Reported 1 week of progressive dyspnea, orthopnea, and lower extremity edema. Positive 40 lbs weight gain over last 2 months. On her initial physical examination her blood pressure 119/53, HR 81-98, RR 18 to 24, 02 saturation 95 to 07% on 3 L per [place]. Lungs with bibasilar rales, with no wheezing, heart with S1 and S2 present and rhythmic with no gallops, abdomen not distended , positive lower extremity edema ++.   Na 138, K 6,1 CL 104 bicarbonate 25 glucose 61 bun 31 cr 1,52  BNP 594  Wbc 7,0 hgb 12,3 plt 199   Chest radiograph with cardiomegaly, bilateral hilar vascular congestion, bilateral interstitial infiltrates predominant at the lower lobes.   EKG 88 bpm, left axis with left anterior fascicular block, sinus rhythm with poor R wave progression, no significant ST segmentor T wave changes.   Patient was placed on furosemide for diuresis.   Echocardiogram with reduced LV systolic function and positive wall motion abnormalities, suggesting ischemic heart disease.   Cardiology was consulted and patient was placed on guideline directed medical therapy for heart failure.  Plan for further ischemic work  up during this hospitalization.  Patient had ventricular tachycardia cardiac arrest at around 4 am, she had ACLS per protocol, for 25 minutes.  Unfortunately did not recover her pulse and died. Her family was informed.      Consultations: cardiology   The results of significant diagnostics from this hospitalization (including imaging, microbiology, ancillary and laboratory) are listed below for reference.   Significant Diagnostic Studies: [place] EKG SITE RITE  Result Date: 11/06/2021 If Site Rite image not attached, placement could not be confirmed due to current cardiac rhythm.  ECHOCARDIOGRAM COMPLETE  Result Date: 11/05/2021    ECHOCARDIOGRAM REPORT   Patient Name:   Marie Stone Date of Exam: 11/05/2021 Medical Rec #:  [id number]     Height:       64.0 in Accession #:    [id number]    Weight:       197.0 lb Date of Birth:  Jan 19, 1951      BSA:          1.944 m Patient Age:    71 years      BP:           152/135 mmHg Patient Gender: F             HR:           94 bpm. Exam Location:  Inpatient Procedure: 2D Echo, Cardiac Doppler, Color Doppler and Intracardiac            Opacification Agent Indications:    CHF-Acute Diastolic [id number]  History:  Patient has prior history of Echocardiogram examinations, most                 recent 02/16/2013. Risk Factors:Diabetes.  Sonographer:    Eulah Pont RDCS Referring Phys: [id number] Angie Fava IMPRESSIONS  1. Left ventricular ejection fraction, by estimation, is 20 to 25%. The left ventricle has severely decreased function. The left ventricle demonstrates regional wall motion abnormalities with anteroseptal, anterior, inferoseptal, and apical akinesis. No  LV thrombus noted. The left ventricular internal cavity size was mildly dilated. Left ventricular diastolic parameters are consistent with Grade II diastolic dysfunction (pseudonormalization).  2. Right ventricular systolic function is normal. The right ventricular size is normal. There is mildly  elevated pulmonary artery systolic pressure. The estimated right ventricular systolic pressure is 36.2 mmHg.  3. Left atrial size was mildly dilated.  4. The mitral valve is normal in structure. Trivial mitral valve regurgitation. No evidence of mitral stenosis.  5. The aortic valve is tricuspid. Aortic valve regurgitation is not visualized. No aortic stenosis is present.  6. The inferior vena cava is dilated in size with <50% respiratory variability, suggesting right atrial pressure of 15 mmHg. FINDINGS  Left Ventricle: Left ventricular ejection fraction, by estimation, is 20 to 25%. The left ventricle has severely decreased function. The left ventricle demonstrates regional wall motion abnormalities. Definity contrast agent was given IV to delineate the left ventricular endocardial borders. The left ventricular internal cavity size was mildly dilated. There is no left ventricular hypertrophy. Left ventricular diastolic parameters are consistent with Grade II diastolic dysfunction (pseudonormalization). Right Ventricle: The right ventricular size is normal. No increase in right ventricular wall thickness. Right ventricular systolic function is normal. There is mildly elevated pulmonary artery systolic pressure. The tricuspid regurgitant velocity is 2.30  m/s, and with an assumed right atrial pressure of 15 mmHg, the estimated right ventricular systolic pressure is 36.2 mmHg. Left Atrium: Left atrial size was mildly dilated. Right Atrium: Right atrial size was normal in size. Pericardium: There is no evidence of pericardial effusion. Mitral Valve: The mitral valve is normal in structure. Trivial mitral valve regurgitation. No evidence of mitral valve stenosis. Tricuspid Valve: The tricuspid valve is normal in structure. Tricuspid valve regurgitation is trivial. Aortic Valve: The aortic valve is tricuspid. Aortic valve regurgitation is not visualized. No aortic stenosis is present. Pulmonic Valve: The pulmonic valve  was normal in structure. Pulmonic valve regurgitation is not visualized. Aorta: The aortic root is normal in size and structure. Venous: The inferior vena cava is dilated in size with less than 50% respiratory variability, suggesting right atrial pressure of 15 mmHg. IAS/Shunts: No atrial level shunt detected by color flow Doppler.  LEFT VENTRICLE PLAX 2D LVIDd:         5.60 cm      Diastology LVIDs:         4.10 cm      LV e' medial:    4.95 cm/s LV PW:         0.90 cm      LV E/e' medial:  26.5 LV IVS:        0.80 cm      LV e' lateral:   8.04 cm/s LVOT diam:     1.90 cm      LV E/e' lateral: 16.3 LV SV:         46 LV SV Index:   23 LVOT Area:     2.84 cm  LV Volumes (MOD) LV vol d, MOD  A2C: 97.6 ml LV vol d, MOD A4C: 146.0 ml LV vol s, MOD A2C: 76.5 ml LV vol s, MOD A4C: 108.0 ml LV SV MOD A2C:     21.1 ml LV SV MOD A4C:     146.0 ml LV SV MOD BP:      31.9 ml RIGHT VENTRICLE RV S prime:     11.00 cm/s TAPSE (M-mode): 1.8 cm LEFT ATRIUM             Index        RIGHT ATRIUM           Index LA diam:        3.90 cm 2.01 cm/m   RA Area:     13.30 cm LA Vol (A2C):   49.6 ml 25.52 ml/m  RA Volume:   30.80 ml  15.84 ml/m LA Vol (A4C):   46.3 ml 23.82 ml/m LA Biplane Vol: 50.4 ml 25.93 ml/m  AORTIC VALVE LVOT Vmax:   85.00 cm/s LVOT Vmean:  58.700 cm/s LVOT VTI:    0.161 m  AORTA Ao Root diam: 3.10 cm Ao Asc diam:  3.00 cm MITRAL VALVE                TRICUSPID VALVE MV Area (PHT): 5.38 cm     TR Peak grad:   21.2 mmHg MV Decel Time: 141 msec     TR Vmax:        230.00 cm/s MV E velocity: 131.00 cm/s MV A velocity: 103.00 cm/s  SHUNTS MV E/A ratio:  1.27         Systemic VTI:  0.16 m                             Systemic Diam: 1.90 cm Dalton McleanMD Electronically signed by Wilfred Lacy Signature Date/Time: 11/05/2021/5:25:53 PM    Final    DG Chest 2 View  Result Date: 11/11/21 CLINICAL DATA:  Shortness of breath. EXAM: CHEST - 2 VIEW COMPARISON:  Chest x-ray 02/13/2013 FINDINGS: The heart is enlarged.  There are central interstitial markings bilaterally with small pleural effusions. There are patchy opacities in the lung bases. No pneumothorax or acute fracture. IMPRESSION: 1.  Cardiomegaly with mild interstitial edema pattern. 2. Small pleural effusions with bibasilar atelectasis/airspace disease. Electronically Signed   By: Darliss Cheney M.D.   On: 11-11-2021 16:12    Microbiology: Recent Results (from the past 240 hour(s))  Resp Panel by RT-PCR (Flu A&B, Covid) Anterior Nasal Swab     Status: None   Collection Time: 11/05/21  6:00 AM   Specimen: Anterior Nasal Swab  Result Value Ref Range Status   SARS Coronavirus 2 by RT PCR NEGATIVE NEGATIVE Final    Comment: (NOTE) SARS-CoV-2 target nucleic acids are NOT DETECTED.  The SARS-CoV-2 RNA is generally detectable in upper respiratory specimens during the acute phase of infection. The lowest concentration of SARS-CoV-2 viral copies this assay can detect is 138 copies/mL. A negative result does not preclude SARS-Cov-2 infection and should not be used as the sole basis for treatment or other patient management decisions. A negative result may occur with  improper specimen collection/handling, submission of specimen other than nasopharyngeal swab, presence of viral mutation(s) within the areas targeted by this assay, and inadequate number of viral copies(<138 copies/mL). A negative result must be combined with clinical observations, patient history, and epidemiological information. The expected result is Negative.  Fact Sheet for  Patients:  EntrepreneurPulse.com.au  Fact Sheet for Healthcare Providers:  IncredibleEmployment.be  This test is no t yet approved or cleared by the [place] FDA and  has been authorized for detection and/or diagnosis of SARS-CoV-2 by FDA under an Emergency Use Authorization (EUA). This EUA will remain  in effect (meaning this test can be used) for the duration of  the COVID-19 declaration under Section 564(b)(1) of the Act, 21 U.S.C.section 360bbb-3(b)(1), unless the authorization is terminated  or revoked sooner.       Influenza A by PCR NEGATIVE NEGATIVE Final   Influenza B by PCR NEGATIVE NEGATIVE Final    Comment: (NOTE) The Xpert Xpress SARS-CoV-2/FLU/RSV plus assay is intended as an aid in the diagnosis of influenza from Nasopharyngeal swab specimens and should not be used as a sole basis for treatment. Nasal washings and aspirates are unacceptable for Xpert Xpress SARS-CoV-2/FLU/RSV testing.  Fact Sheet for Patients: EntrepreneurPulse.com.au  Fact Sheet for Healthcare Providers: IncredibleEmployment.be  This test is not yet approved or cleared by the [place] FDA and has been authorized for detection and/or diagnosis of SARS-CoV-2 by FDA under an Emergency Use Authorization (EUA). This EUA will remain in effect (meaning this test can be used) for the duration of the COVID-19 declaration under Section 564(b)(1) of the Act, 21 U.S.C. section 360bbb-3(b)(1), unless the authorization is terminated or revoked.  Performed at [facility], [place] [address], [place],  [address]       Signed: Tawni Millers, MD

## 2021-11-24 NOTE — Code Documentation (Signed)
  Patient Name: Marie Stone   MRN: [id number]   Date of Birth/ Sex: Oct 03, 1950 , female      Admission Date: Dec 04, 2021  Attending Provider: Coralie Keens,*  Primary Diagnosis: Acute diastolic heart failure (HCC) [I50.31] Acute congestive heart failure, unspecified heart failure type (HCC) [I50.9]   Indication: Pt was in her usual state of health until this AM, when she was noted to be in [facility]. Code blue was subsequently called. At the time of arrival on scene, ACLS protocol was underway.   Technical Description:  - CPR performance duration:  25 minutes  - Was defibrillation or cardioversion used? Yes   - Was external pacer placed? Yes  - Was patient intubated pre/post CPR? Yes   Medications Administered: Y = Yes; Blank = No Amiodarone  y  Atropine    Calcium    Epinephrine  y  Lidocaine  y  Magnesium    Norepinephrine    Phenylephrine    Sodium bicarbonate    Vasopressin    Other    Post CPR evaluation:  - Final Status - Was patient successfully resuscitated ? No   Miscellaneous Information:  - Time of death:  4:31 AM  - Primary team notified?  Yes  - Family Notified? Family en route - to be notified upon arrival     Adron Bene, MD   10/30/2021, 4:37 AM

## 2021-11-24 NOTE — Progress Notes (Signed)
Received a call regarding the patient being found unresponsible.  Code blue Was activated.  Presented at bedside, the CODE BLUE team was already at bedside, CPR, defibrillation were being performed.  ACLS protocol was underway.  After 25 minutes of attempt at resuscitation, the patient was pronounced.  Time of death 4:31 AM.

## 2021-11-24 NOTE — Progress Notes (Addendum)
Pt had a total of 25 beats of V tach, then back to ventricular bigeminy. Pt asymptomatic. MD (Dr. Lendell Caprice) paged.

## 2021-11-24 DEATH — deceased

## 2021-12-03 MED FILL — Medication: Qty: 1 | Status: AC

## 2023-01-21 IMAGING — US US EXTREM LOW VENOUS*R*
1 series · 14 of 24 positions shown · non-contrast
Comparison: 02/14/2013

CLINICAL DATA: Right lower extremity pain and swelling

EXAM:
RIGHT LOWER EXTREMITY VENOUS DOPPLER ULTRASOUND
TECHNIQUE: Gray-scale sonography with compression, as well as color and duplex
ultrasound, were performed to evaluate the deep venous system(s)
from the level of the common femoral vein through the popliteal and
proximal calf veins.

[Series 1: us venous img lower uni right (dvt) · portal-venous · 33 acquisitions, 14 frames shown]
[im 1/33]
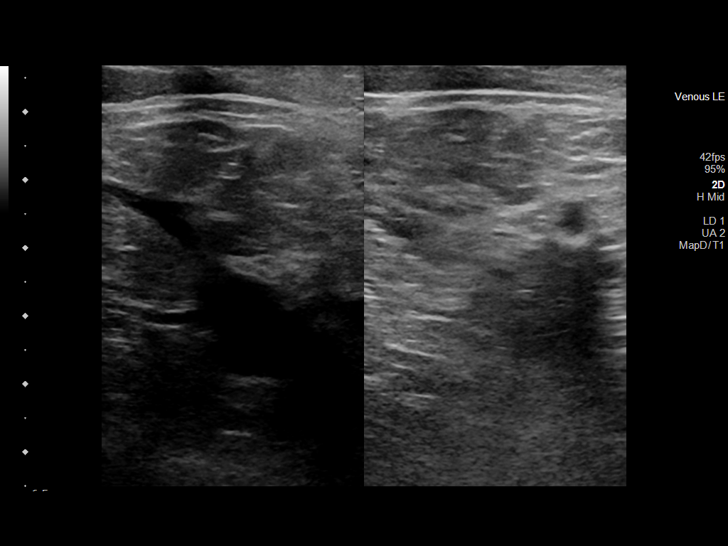
[im 3/33]
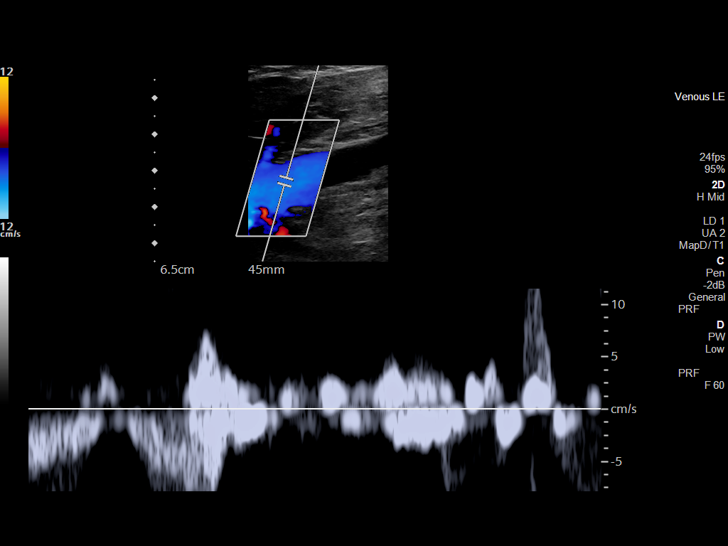
[im 6/33]
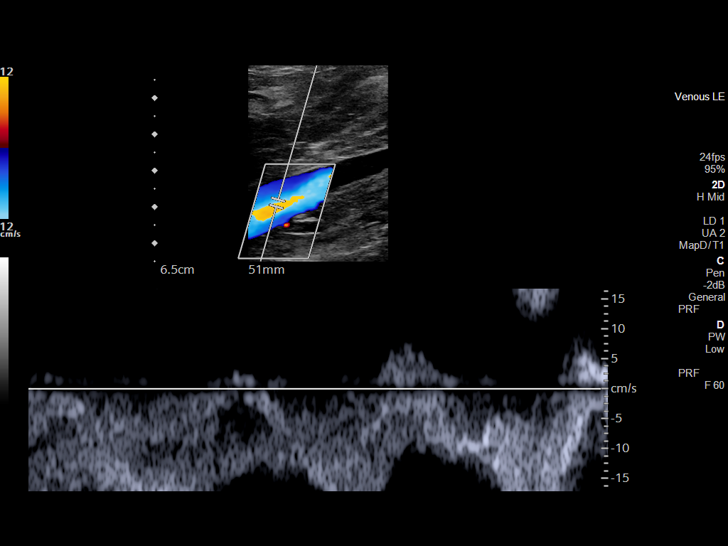
[im 9/33]
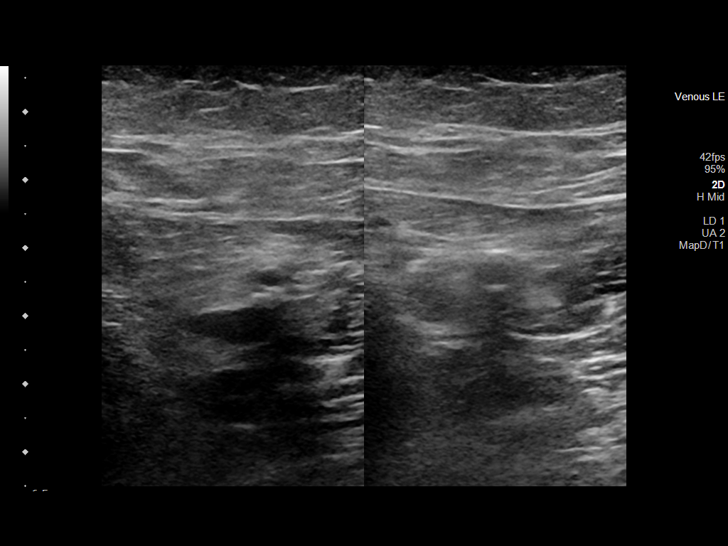
[im 10/33]
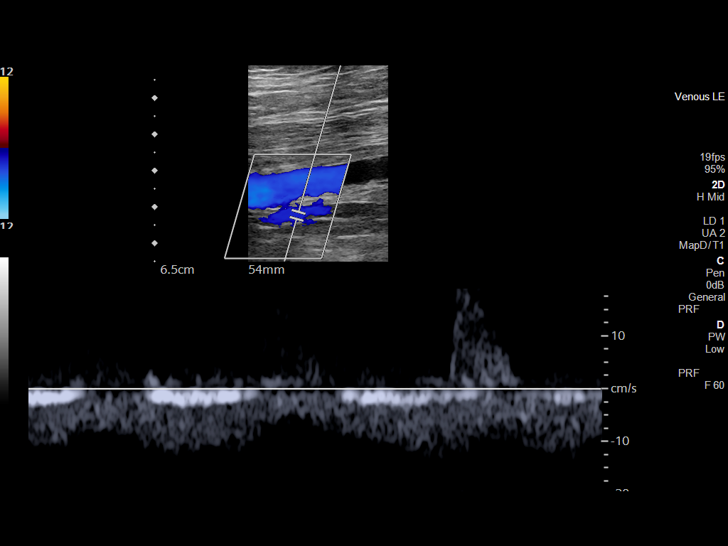
[im 13/33]
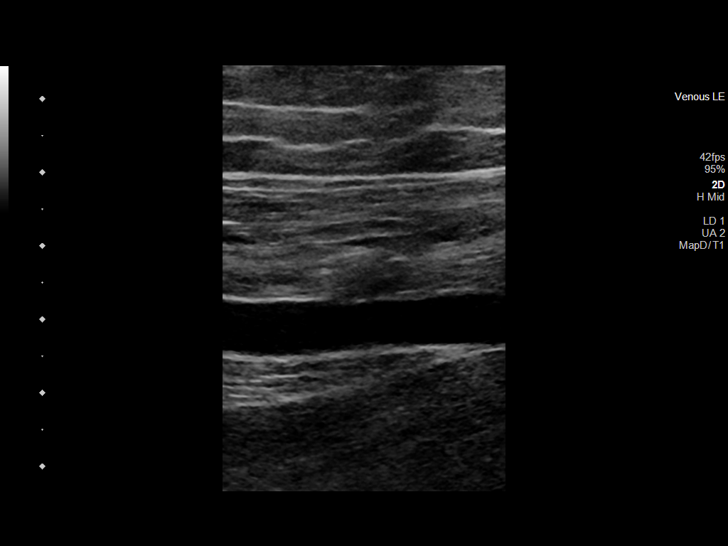
[im 16/33]
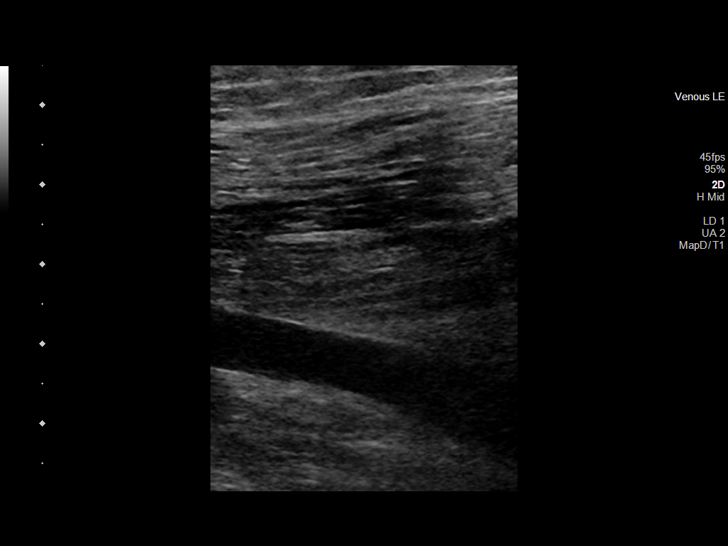
[im 19/33]
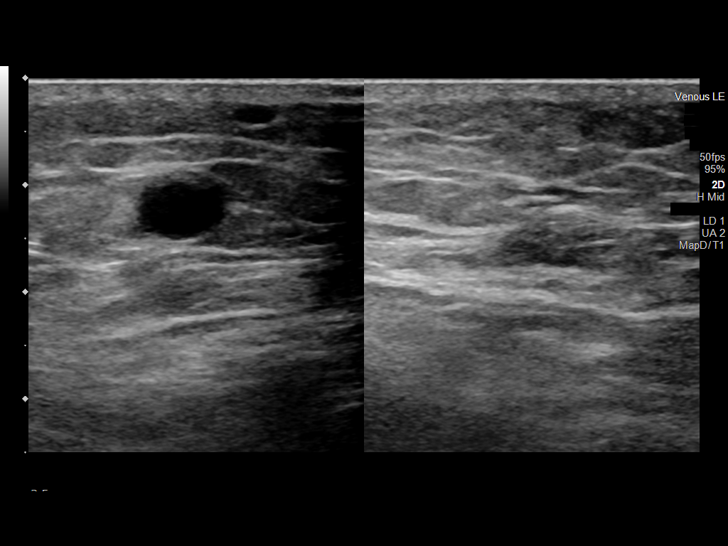
[im 21/33]
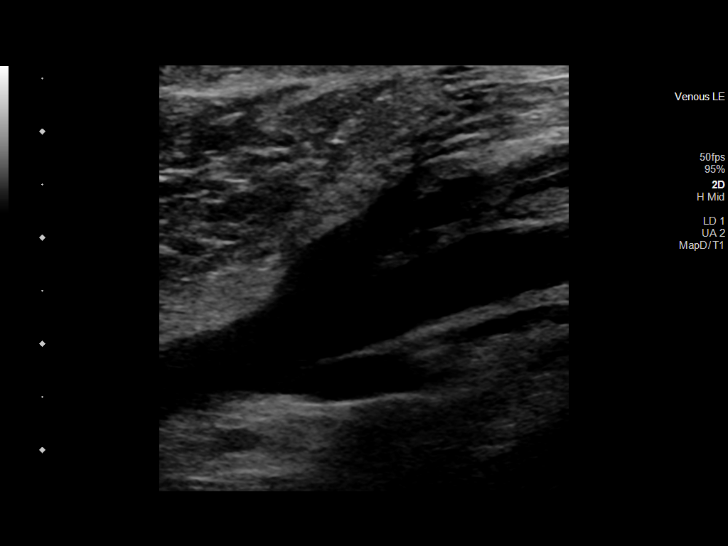
[im 24/33]
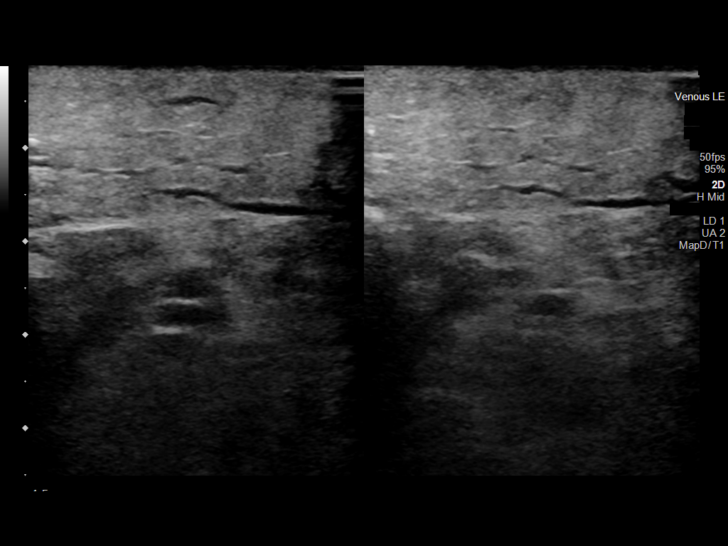
[im 27/33]
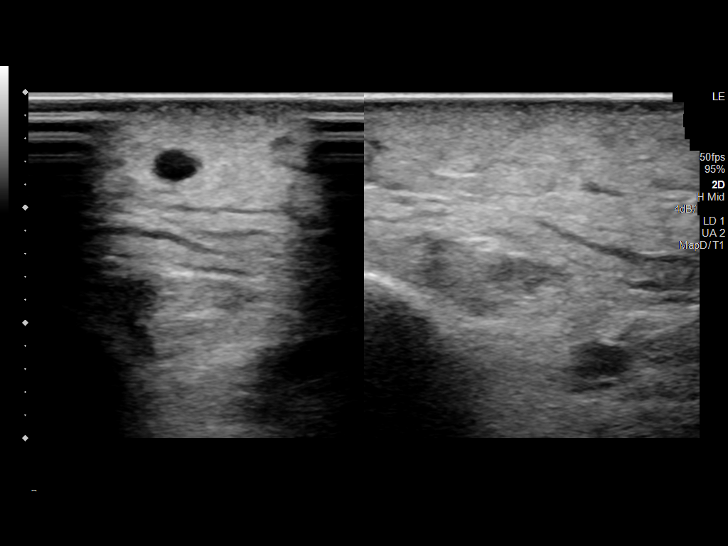
[im 28/33]
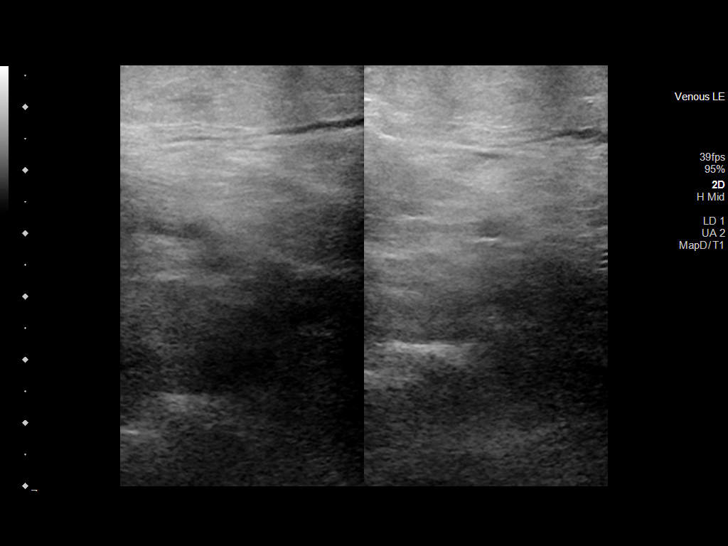
[im 31/33]
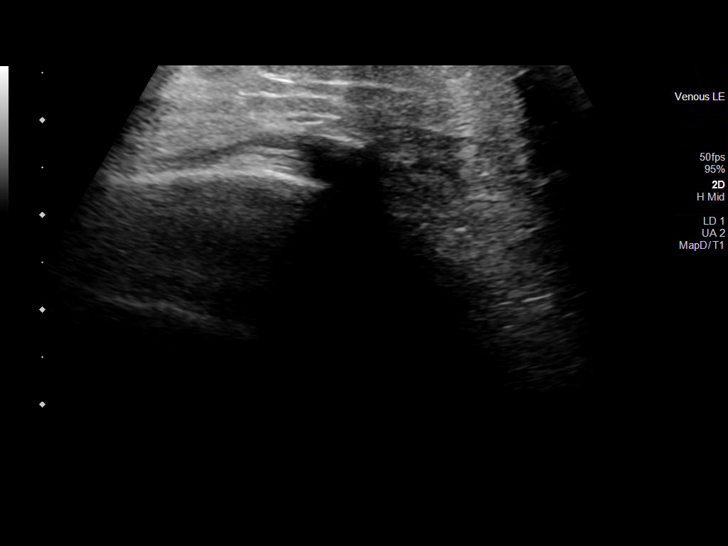
[im 33/33]
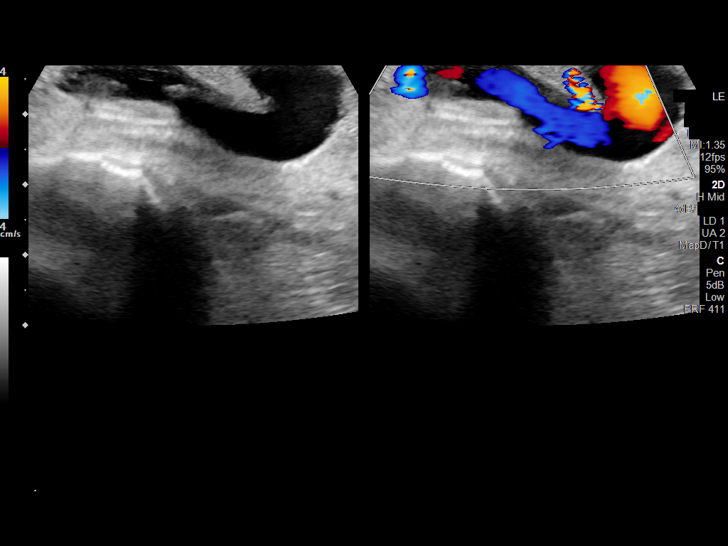

[14 of 24 positions shown; findings below may reference images not displayed]

FINDINGS: VENOUS

Normal compressibility of the common femoral, superficial femoral,
and popliteal veins, as well as the visualized calf veins.
Visualized portions of profunda femoral vein and great saphenous
vein unremarkable. No filling defects to suggest DVT on grayscale or
color Doppler imaging. Doppler waveforms show normal direction of
venous flow, normal respiratory plasticity and response to
augmentation.

Limited views of the contralateral common femoral vein are
unremarkable.

OTHER

None.

Limitations: none
IMPRESSION: 1. No right lower extremity DVT.
2. Patent varicose vein noted in the right medial calf.
